# Patient Record
Sex: Female | Born: 1975
Health system: Southern US, Community
[De-identification: ages and names within clinical notes are randomized; demographics above are authoritative.]

## PROBLEM LIST (undated history)

## (undated) DIAGNOSIS — F3181 Bipolar II disorder: Secondary | ICD-10-CM

## (undated) HISTORY — DX: Bipolar II disorder: F31.81

---

## 2014-09-07 ENCOUNTER — Other Ambulatory Visit (HOSPITAL_COMMUNITY)
Admission: RE | Admit: 2014-09-07 | Discharge: 2014-09-07 | Disposition: A | Discharge status: Home / Self Care | Payer: BLUE CROSS/BLUE SHIELD | Source: Ambulatory Visit | Attending: Obstetrics & Gynecology | Admitting: Obstetrics & Gynecology

## 2014-09-07 DIAGNOSIS — Z113 Encounter for screening for infections with a predominantly sexual mode of transmission: Secondary | ICD-10-CM | POA: Diagnosis present

## 2014-09-07 DIAGNOSIS — Z1151 Encounter for screening for human papillomavirus (HPV): Secondary | ICD-10-CM | POA: Insufficient documentation

## 2014-09-07 DIAGNOSIS — Z01411 Encounter for gynecological examination (general) (routine) with abnormal findings: Secondary | ICD-10-CM | POA: Diagnosis present

## 2016-10-22 MED FILL — ZALEPLON 10 MG CAPSULE: 10 | 30 days supply | Qty: 30 | Fill #0

## 2016-10-22 MED FILL — lamoTRIgine 100 MG TABS: 100 | 30 days supply | Qty: 30 | Fill #0

## 2016-10-22 MED FILL — DIVALPROEX SOD ER 500 MG TA: 500 | 30 days supply | Qty: 60 | Fill #0

## 2016-11-19 MED FILL — lamoTRIgine 100 MG TABS: 100 | 30 days supply | Qty: 30 | Fill #1

## 2016-11-19 MED FILL — DIVALPROEX SOD ER 500 MG TA: 500 | 30 days supply | Qty: 60 | Fill #1

## 2016-12-19 DIAGNOSIS — F3181 Bipolar II disorder: Secondary | ICD-10-CM | POA: Diagnosis not present

## 2016-12-24 MED FILL — lamoTRIgine 100 MG TABS: 100 | 90 days supply | Qty: 90 | Fill #0

## 2016-12-24 MED FILL — DIVALPROEX SOD ER 500 MG TA: 500 | 90 days supply | Qty: 180 | Fill #0

## 2017-01-09 DIAGNOSIS — Z872 Personal history of diseases of the skin and subcutaneous tissue: Secondary | ICD-10-CM | POA: Diagnosis not present

## 2017-01-09 DIAGNOSIS — L821 Other seborrheic keratosis: Secondary | ICD-10-CM | POA: Diagnosis not present

## 2017-01-09 DIAGNOSIS — L814 Other melanin hyperpigmentation: Secondary | ICD-10-CM | POA: Diagnosis not present

## 2017-01-09 DIAGNOSIS — D225 Melanocytic nevi of trunk: Secondary | ICD-10-CM | POA: Diagnosis not present

## 2017-01-23 DIAGNOSIS — R946 Abnormal results of thyroid function studies: Secondary | ICD-10-CM | POA: Diagnosis not present

## 2017-01-23 DIAGNOSIS — R630 Anorexia: Secondary | ICD-10-CM | POA: Diagnosis not present

## 2017-01-25 DIAGNOSIS — F3181 Bipolar II disorder: Secondary | ICD-10-CM | POA: Diagnosis not present

## 2017-01-28 MED FILL — busPIRone HCL 15 MG TABS: 15 | 30 days supply | Qty: 60 | Fill #0

## 2017-03-27 MED FILL — lamoTRIgine 100 MG TABS: 100 | 90 days supply | Qty: 90 | Fill #1

## 2017-03-27 MED FILL — DIVALPROEX SOD ER 500 MG TA: 500 | 90 days supply | Qty: 180 | Fill #1

## 2017-04-12 DIAGNOSIS — R7989 Other specified abnormal findings of blood chemistry: Secondary | ICD-10-CM | POA: Diagnosis not present

## 2017-04-12 DIAGNOSIS — R630 Anorexia: Secondary | ICD-10-CM | POA: Diagnosis not present

## 2017-04-19 DIAGNOSIS — Z202 Contact with and (suspected) exposure to infections with a predominantly sexual mode of transmission: Secondary | ICD-10-CM | POA: Diagnosis not present

## 2017-04-29 DIAGNOSIS — F3181 Bipolar II disorder: Secondary | ICD-10-CM | POA: Diagnosis not present

## 2017-06-21 MED FILL — DIVALPROEX SOD ER 500 MG TA: 500 | 30 days supply | Qty: 60 | Fill #0

## 2017-06-21 MED FILL — lamoTRIgine 100 MG TABS: 100 | 30 days supply | Qty: 30 | Fill #0

## 2017-06-24 MED FILL — ZALEPLON 10 MG CAPSULE: 10 | 30 days supply | Qty: 30 | Fill #0

## 2017-07-17 MED FILL — DIVALPROEX SOD ER 500 MG TA: 500 | 30 days supply | Qty: 60 | Fill #0

## 2017-07-17 MED FILL — lamoTRIgine 100 MG TABS: 100 | 30 days supply | Qty: 30 | Fill #0

## 2017-08-23 MED FILL — DIVALPROEX SOD ER 500 MG TA: 500 | 30 days supply | Qty: 60 | Fill #0

## 2017-08-23 MED FILL — lamoTRIgine 100 MG TABS: 100 | 30 days supply | Qty: 30 | Fill #0

## 2017-08-23 MED FILL — ZALEPLON 10 MG CAPSULE: 10 | 30 days supply | Qty: 30 | Fill #0

## 2017-09-25 MED FILL — lamoTRIgine 100 MG TABS: 100 | 30 days supply | Qty: 30 | Fill #0

## 2017-09-25 MED FILL — DIVALPROEX SOD ER 500 MG TA: 500 | 30 days supply | Qty: 60 | Fill #0

## 2017-09-30 DIAGNOSIS — F3181 Bipolar II disorder: Secondary | ICD-10-CM | POA: Diagnosis not present

## 2017-10-18 MED FILL — ALPRAZolam 0.25 MG TABS: 0.25 | 30 days supply | Qty: 30 | Fill #0

## 2017-10-28 MED FILL — lamoTRIgine 100 MG TABS: 100 | 90 days supply | Qty: 90 | Fill #0

## 2017-10-28 MED FILL — ZALEPLON 10 MG CAPSULE: 10 | 30 days supply | Qty: 30 | Fill #0

## 2017-10-28 MED FILL — DIVALPROEX SOD ER 500 MG TA: 500 | 90 days supply | Qty: 180 | Fill #0

## 2017-11-19 ENCOUNTER — Other Ambulatory Visit: Payer: Self-pay | Admitting: Nurse Practitioner

## 2017-11-19 ENCOUNTER — Other Ambulatory Visit (HOSPITAL_COMMUNITY)
Admission: RE | Admit: 2017-11-19 | Discharge: 2017-11-19 | Disposition: A | Discharge status: Home / Self Care | Payer: 59 | Source: Ambulatory Visit | Attending: Nurse Practitioner | Admitting: Nurse Practitioner

## 2017-11-19 DIAGNOSIS — N644 Mastodynia: Secondary | ICD-10-CM | POA: Diagnosis not present

## 2017-11-19 DIAGNOSIS — Z01419 Encounter for gynecological examination (general) (routine) without abnormal findings: Secondary | ICD-10-CM | POA: Diagnosis not present

## 2017-11-19 DIAGNOSIS — Z113 Encounter for screening for infections with a predominantly sexual mode of transmission: Secondary | ICD-10-CM | POA: Diagnosis not present

## 2017-11-22 LAB — CYTOLOGY - PAP
CHLAMYDIA, DNA PROBE: NEGATIVE
DIAGNOSIS: NEGATIVE
HPV: NOT DETECTED
Neisseria Gonorrhea: NEGATIVE

## 2017-12-27 MED FILL — ZALEPLON 10 MG CAPSULE: 10 | 30 days supply | Qty: 30 | Fill #1

## 2018-01-13 ENCOUNTER — Ambulatory Visit: Payer: Self-pay | Admitting: Nurse Practitioner

## 2018-01-13 VITALS — BP 108/72 | HR 67 | Temp 98.5°F | Resp 20 | Ht 68.0 in | Wt 140.2 lb

## 2018-01-13 DIAGNOSIS — Z0000 Encounter for general adult medical examination without abnormal findings: Secondary | ICD-10-CM

## 2018-01-13 NOTE — Progress Notes (Signed)
Subjective:  Cindy Woods is a 42 y.o. female who presents for basic physical exam.  The patient is presenting for a routine exam to keep her insurance premiums down as a requirement for [employer] health.  Patient denies any current health related concerns.  Patient does have a history of depression for which she takes Depakote and Lamictal.  Patient states her symptoms are well controlled at this time.  The patient denies any other past medical history such as heart disease, lung disease, liver disease, kidney disease, diabetes, hypertension, or seizures.  The patient's last menstrual period was Dec 23, 2017.  The patient denies any past surgical history.  The patient is not married, does not have children and lives alone.  The patient denies the use of alcohol, smoking, or recreational drugs.   Social History   Tobacco Use  . Smoking status: Not on file  Substance Use Topics  . Alcohol use: Not on file  . Drug use: Not on file    No Known Allergies  Current Outpatient Medications  Medication Sig Dispense Refill  . divalproex (DEPAKOTE) 500 MG DR tablet Take 1,000 mg by mouth 3 (three) times daily.    Marland Kitchen lamoTRIgine (LAMICTAL) 100 MG tablet Take 100 mg by mouth daily.     No current facility-administered medications for this visit.     Review of Systems  Constitutional: Negative.   HENT: Negative.   Eyes: Negative.   Respiratory: Negative.   Cardiovascular: Negative.   Gastrointestinal: Negative.   Genitourinary: Negative.   Musculoskeletal: Negative.   Skin: Negative.   Neurological: Negative.   Endo/Heme/Allergies: Negative.   Psychiatric/Behavioral: Negative.      Objective:  BP 108/72 (BP Location: Right Leg, Patient Position: Sitting, Cuff Size: Normal)   Pulse 67   Temp 98.5 F (36.9 C) (Oral)   Resp 20   Ht 5' 0.96" (1.548 m)   Wt 140 lb 3.2 oz (63.6 kg)   SpO2 97%   BMI 26.53 kg/m   General Appearance:  Alert, cooperative, no distress, appears stated age   Head:  Normocephalic, without obvious abnormality, atraumatic  Eyes:  PERRL, conjunctiva/corneas clear, EOM's intact, fundi benign, both eyes  Ears:  Normal TM's and external ear canals, both ears  Nose: Nares normal, septum midline,mucosa normal, no drainage or sinus tenderness  Throat: Lips, mucosa, and tongue normal; teeth and gums normal  Neck: Supple, symmetrical, trachea midline, no adenopathy;  thyroid: not enlarged, symmetric, no tenderness/mass/nodules; no carotid bruit or JVD  Back:   Symmetric, no curvature, ROM normal, no CVA tenderness  Lungs:   Clear to auscultation bilaterally, respirations unlabored  Breasts:  Deferred  Heart:  Regular rate and rhythm, S1 and S2 normal, no murmur, rub, or gallop  Abdomen:   Soft, non-tender, bowel sounds active all four quadrants,  no masses, no organomegaly  Pelvic: Deferred  Extremities: Extremities normal, atraumatic, no cyanosis or edema  Pulses: 2+ and symmetric  Skin: Skin color, texture, turgor normal, no rashes or lesions  Lymph nodes: Cervical, supraclavicular, and axillary nodes normal  Neurologic: Normal     Assessment:  basic physical exam    Plan:  Patient education provided.  She is instructed to follow-up with primary care physician for lab work, or additional screening and follow-up.  The patient was given patient education for health maintenance and health prevention for her age group.  The patient will continue her regular routine for diet and exercise.  The patient verbalizes no understanding and has no  questions at time of discharge. Patient will follow up with PCP.

## 2018-01-13 NOTE — Patient Instructions (Signed)
Health Maintenance, Female Adopting a healthy lifestyle and getting preventive care can go a long way to promote health and wellness. Talk with your health care provider about what schedule of regular examinations is right for you. This is a good chance for you to check in with your provider about disease prevention and staying healthy. In between checkups, there are plenty of things you can do on your own. Experts have done a lot of research about which lifestyle changes and preventive measures are most likely to keep you healthy. Ask your health care provider for more information. Weight and diet Eat a healthy diet  Be sure to include plenty of vegetables, fruits, low-fat dairy products, and lean protein.  Do not eat a lot of foods high in solid fats, added sugars, or salt.  Get regular exercise. This is one of the most important things you can do for your health. ? Most adults should exercise for at least 150 minutes each week. The exercise should increase your heart rate and make you sweat (moderate-intensity exercise). ? Most adults should also do strengthening exercises at least twice a week. This is in addition to the moderate-intensity exercise.  Maintain a healthy weight  Body mass index (BMI) is a measurement that can be used to identify possible weight problems. It estimates body fat based on height and weight. Your health care provider can help determine your BMI and help you achieve or maintain a healthy weight.  For females 20 years of age and older: ? A BMI below 18.5 is considered underweight. ? A BMI of 18.5 to 24.9 is normal. ? A BMI of 25 to 29.9 is considered overweight. ? A BMI of 30 and above is considered obese.  Watch levels of cholesterol and blood lipids  You should start having your blood tested for lipids and cholesterol at 40 years of age, then have this test every 5 years.  You may need to have your cholesterol levels checked more often if: ? Your lipid or  cholesterol levels are high. ? You are older than 40 years of age. ? You are at high risk for heart disease.  Cancer screening Lung Cancer  Lung cancer screening is recommended for adults 55-80 years old who are at high risk for lung cancer because of a history of smoking.  A yearly low-dose CT scan of the lungs is recommended for people who: ? Currently smoke. ? Have quit within the past 15 years. ? Have at least a 30-pack-year history of smoking. A pack year is smoking an average of one pack of cigarettes a day for 1 year.  Yearly screening should continue until it has been 15 years since you quit.  Yearly screening should stop if you develop a health problem that would prevent you from having lung cancer treatment.  Breast Cancer  Practice breast self-awareness. This means understanding how your breasts normally appear and feel.  It also means doing regular breast self-exams. Let your health care provider know about any changes, no matter how small.  If you are in your 20s or 30s, you should have a clinical breast exam (CBE) by a health care provider every 1-3 years as part of a regular health exam.  If you are 40 or older, have a CBE every year. Also consider having a breast X-ray (mammogram) every year.  If you have a family history of breast cancer, talk to your health care provider about genetic screening.  If you are at high risk   for breast cancer, talk to your health care provider about having an MRI and a mammogram every year.  Breast cancer gene (BRCA) assessment is recommended for women who have family members with BRCA-related cancers. BRCA-related cancers include: ? Breast. ? Ovarian. ? Tubal. ? Peritoneal cancers.  Results of the assessment will determine the need for genetic counseling and BRCA1 and BRCA2 testing.  Cervical Cancer Your health care provider may recommend that you be screened regularly for cancer of the pelvic organs (ovaries, uterus, and  vagina). This screening involves a pelvic examination, including checking for microscopic changes to the surface of your cervix (Pap test). You may be encouraged to have this screening done every 3 years, beginning at age 22.  For women ages 56-65, health care providers may recommend pelvic exams and Pap testing every 3 years, or they may recommend the Pap and pelvic exam, combined with testing for human papilloma virus (HPV), every 5 years. Some types of HPV increase your risk of cervical cancer. Testing for HPV may also be done on women of any age with unclear Pap test results.  Other health care providers may not recommend any screening for nonpregnant women who are considered low risk for pelvic cancer and who do not have symptoms. Ask your health care provider if a screening pelvic exam is right for you.  If you have had past treatment for cervical cancer or a condition that could lead to cancer, you need Pap tests and screening for cancer for at least 20 years after your treatment. If Pap tests have been discontinued, your risk factors (such as having a new sexual partner) need to be reassessed to determine if screening should resume. Some women have medical problems that increase the chance of getting cervical cancer. In these cases, your health care provider may recommend more frequent screening and Pap tests.  Colorectal Cancer  This type of cancer can be detected and often prevented.  Routine colorectal cancer screening usually begins at 40 years of age and continues through 40 years of age.  Your health care provider may recommend screening at an earlier age if you have risk factors for colon cancer.  Your health care provider may also recommend using home test kits to check for hidden blood in the stool.  A small camera at the end of a tube can be used to examine your colon directly (sigmoidoscopy or colonoscopy). This is done to check for the earliest forms of colorectal  cancer.  Routine screening usually begins at age 33.  Direct examination of the colon should be repeated every 5-10 years through 40 years of age. However, you may need to be screened more often if early forms of precancerous polyps or small growths are found.  Skin Cancer  Check your skin from head to toe regularly.  Tell your health care provider about any new moles or changes in moles, especially if there is a change in a mole's shape or color.  Also tell your health care provider if you have a mole that is larger than the size of a pencil eraser.  Always use sunscreen. Apply sunscreen liberally and repeatedly throughout the day.  Protect yourself by wearing long sleeves, pants, a wide-brimmed hat, and sunglasses whenever you are outside.  Heart disease, diabetes, and high blood pressure  High blood pressure causes heart disease and increases the risk of stroke. High blood pressure is more likely to develop in: ? People who have blood pressure in the high end of  the normal range (130-139/85-89 mm Hg). ? People who are overweight or obese. ? People who are African American.  If you are 21-29 years of age, have your blood pressure checked every 3-5 years. If you are 3 years of age or older, have your blood pressure checked every year. You should have your blood pressure measured twice-once when you are at a hospital or clinic, and once when you are not at a hospital or clinic. Record the average of the two measurements. To check your blood pressure when you are not at a hospital or clinic, you can use: ? An automated blood pressure machine at a pharmacy. ? A home blood pressure monitor.  If you are between 17 years and 37 years old, ask your health care provider if you should take aspirin to prevent strokes.  Have regular diabetes screenings. This involves taking a blood sample to check your fasting blood sugar level. ? If you are at a normal weight and have a low risk for diabetes,  have this test once every three years after 40 years of age. ? If you are overweight and have a high risk for diabetes, consider being tested at a younger age or more often. Preventing infection Hepatitis B  If you have a higher risk for hepatitis B, you should be screened for this virus. You are considered at high risk for hepatitis B if: ? You were born in a country where hepatitis B is common. Ask your health care provider which countries are considered high risk. ? Your parents were born in a high-risk country, and you have not been immunized against hepatitis B (hepatitis B vaccine). ? You have HIV or AIDS. ? You use needles to inject street drugs. ? You live with someone who has hepatitis B. ? You have had sex with someone who has hepatitis B. ? You get hemodialysis treatment. ? You take certain medicines for conditions, including cancer, organ transplantation, and autoimmune conditions.  Hepatitis C  Blood testing is recommended for: ? Everyone born from [birth year] through 1963. ? Anyone with known risk factors for hepatitis C.  Sexually transmitted infections (STIs)  You should be screened for sexually transmitted infections (STIs) including gonorrhea and chlamydia if: ? You are sexually active and are younger than 40 years of age. ? You are older than 40 years of age and your health care provider tells you that you are at risk for this type of infection. ? Your sexual activity has changed since you were last screened and you are at an increased risk for chlamydia or gonorrhea. Ask your health care provider if you are at risk.  If you do not have HIV, but are at risk, it may be recommended that you take a prescription medicine daily to prevent HIV infection. This is called pre-exposure prophylaxis (PrEP). You are considered at risk if: ? You are sexually active and do not regularly use condoms or know the HIV status of your partner(s). ? You take drugs by injection. ? You are  sexually active with a partner who has HIV.  Talk with your health care provider about whether you are at high risk of being infected with HIV. If you choose to begin PrEP, you should first be tested for HIV. You should then be tested every 3 months for as long as you are taking PrEP. Pregnancy  If you are premenopausal and you may become pregnant, ask your health care provider about preconception counseling.  If you may become  pregnant, take 400 to 800 micrograms (mcg) of folic acid every day.  If you want to prevent pregnancy, talk to your health care provider about birth control (contraception). Osteoporosis and menopause  Osteoporosis is a disease in which the bones lose minerals and strength with aging. This can result in serious bone fractures. Your risk for osteoporosis can be identified using a bone density scan.  If you are 65 years of age or older, or if you are at risk for osteoporosis and fractures, ask your health care provider if you should be screened.  Ask your health care provider whether you should take a calcium or vitamin D supplement to lower your risk for osteoporosis.  Menopause may have certain physical symptoms and risks.  Hormone replacement therapy may reduce some of these symptoms and risks. Talk to your health care provider about whether hormone replacement therapy is right for you. Follow these instructions at home:  Schedule regular health, dental, and eye exams.  Stay current with your immunizations.  Do not use any tobacco products including cigarettes, chewing tobacco, or electronic cigarettes.  If you are pregnant, do not drink alcohol.  If you are breastfeeding, limit how much and how often you drink alcohol.  Limit alcohol intake to no more than 1 drink per day for nonpregnant women. One drink equals 12 ounces of beer, 5 ounces of wine, or 1 ounces of hard liquor.  Do not use street drugs.  Do not share needles.  Ask your health care  provider for help if you need support or information about quitting drugs.  Tell your health care provider if you often feel depressed.  Tell your health care provider if you have ever been abused or do not feel safe at home. This information is not intended to replace advice given to you by your health care provider. Make sure you discuss any questions you have with your health care provider. Document Released: 02/05/2011 Document Revised: 12/29/2015 Document Reviewed: 04/26/2015 Elsevier Interactive Patient Education  2018 [employer]  Preventive Care 18-39 Years, Female Preventive care refers to lifestyle choices and visits with your health care provider that can promote health and wellness. What does preventive care include?  A yearly physical exam. This is also called an annual well check.  Dental exams once or twice a year.  Routine eye exams. Ask your health care provider how often you should have your eyes checked.  Personal lifestyle choices, including: ? Daily care of your teeth and gums. ? Regular physical activity. ? Eating a healthy diet. ? Avoiding tobacco and drug use. ? Limiting alcohol use. ? Practicing safe sex. ? Taking vitamin and mineral supplements as recommended by your health care provider. What happens during an annual well check? The services and screenings done by your health care provider during your annual well check will depend on your age, overall health, lifestyle risk factors, and family history of disease. Counseling Your health care provider may ask you questions about your:  Alcohol use.  Tobacco use.  Drug use.  Emotional well-being.  Home and relationship well-being.  Sexual activity.  Eating habits.  Work and work environment.  Method of birth control.  Menstrual cycle.  Pregnancy history.  Screening You may have the following tests or measurements:  Height, weight, and BMI.  Diabetes screening. This is done by  checking your blood sugar (glucose) after you have not eaten for a while (fasting).  Blood pressure.  Lipid and cholesterol levels. These may be checked   every 5 years starting at age 76.  Skin check.  Hepatitis C blood test.  Hepatitis B blood test.  Sexually transmitted disease (STD) testing.  BRCA-related cancer screening. This may be done if you have a family history of breast, ovarian, tubal, or peritoneal cancers.  Pelvic exam and Pap test. This may be done every 3 years starting at age 64. Starting at age 64, this may be done every 5 years if you have a Pap test in combination with an HPV test.  Discuss your test results, treatment options, and if necessary, the need for more tests with your health care provider. Vaccines Your health care provider may recommend certain vaccines, such as:  Influenza vaccine. This is recommended every year.  Tetanus, diphtheria, and acellular pertussis (Tdap, Td) vaccine. You may need a Td booster every 10 years.  Varicella vaccine. You may need this if you have not been vaccinated.  HPV vaccine. If you are 48 or younger, you may need three doses over 6 months.  Measles, mumps, and rubella (MMR) vaccine. You may need at least one dose of MMR. You may also need a second dose.  Pneumococcal 13-valent conjugate (PCV13) vaccine. You may need this if you have certain conditions and were not previously vaccinated.  Pneumococcal polysaccharide (PPSV23) vaccine. You may need one or two doses if you smoke cigarettes or if you have certain conditions.  Meningococcal vaccine. One dose is recommended if you are age 16-21 years and a first-year college student living in a residence hall, or if you have one of several medical conditions. You may also need additional booster doses.  Hepatitis A vaccine. You may need this if you have certain conditions or if you travel or work in places where you may be exposed to hepatitis A.  Hepatitis B vaccine. You  may need this if you have certain conditions or if you travel or work in places where you may be exposed to hepatitis B.  Haemophilus influenzae type b (Hib) vaccine. You may need this if you have certain risk factors.  Talk to your health care provider about which screenings and vaccines you need and how often you need them. This information is not intended to replace advice given to you by your health care provider. Make sure you discuss any questions you have with your health care provider. Document Released: 09/18/2001 Document Revised: 04/11/2016 Document Reviewed: 05/24/2015 Elsevier Interactive Patient Education  [employer].

## 2018-01-20 DIAGNOSIS — F3181 Bipolar II disorder: Secondary | ICD-10-CM | POA: Diagnosis not present

## 2018-01-27 MED FILL — lamoTRIgine 100 MG TABS: 100 | 90 days supply | Qty: 90 | Fill #0

## 2018-01-27 MED FILL — DIVALPROEX SOD ER 500 MG TA: 500 | 90 days supply | Qty: 180 | Fill #0

## 2018-01-27 MED FILL — ZALEPLON 10 MG CAPSULE: 10 | 30 days supply | Qty: 30 | Fill #0

## 2018-02-27 DIAGNOSIS — F3181 Bipolar II disorder: Secondary | ICD-10-CM | POA: Diagnosis not present

## 2018-03-18 MED FILL — ZALEPLON 10 MG CAPSULE: 10 | 30 days supply | Qty: 30 | Fill #1

## 2018-03-18 MED FILL — TEMAZEPAM 7.5 MG CAPS: 7.5 | 8 days supply | Qty: 16 | Fill #0

## 2018-04-24 MED FILL — DIVALPROEX SOD ER 500 MG TA: 500 | 90 days supply | Qty: 180 | Fill #1

## 2018-04-24 MED FILL — lamoTRIgine 100 MG TABS: 100 | 90 days supply | Qty: 90 | Fill #1

## 2018-04-24 MED FILL — ZALEPLON 10 MG CAPSULE: 10 | 30 days supply | Qty: 30 | Fill #2

## 2018-04-25 MED FILL — ALPRAZolam 0.25 MG TABS: 0.25 | 30 days supply | Qty: 30 | Fill #0

## 2018-05-28 MED FILL — ZALEPLON 10 MG CAPSULE: 10 | 30 days supply | Qty: 30 | Fill #3

## 2018-06-15 DIAGNOSIS — F3181 Bipolar II disorder: Secondary | ICD-10-CM

## 2018-06-15 DIAGNOSIS — F419 Anxiety disorder, unspecified: Secondary | ICD-10-CM | POA: Insufficient documentation

## 2018-06-15 DIAGNOSIS — F458 Other somatoform disorders: Secondary | ICD-10-CM

## 2018-06-15 HISTORY — DX: Bipolar II disorder: F31.81

## 2018-07-01 ENCOUNTER — Telehealth: Payer: Self-pay | Admitting: Psychiatry

## 2018-07-01 NOTE — Telephone Encounter (Signed)
Called pharmacy to let them know it was ok to change sonata from 10mg  to 5mg .

## 2018-07-01 NOTE — Telephone Encounter (Signed)
[facility] called to say they cannot get the Sonata 10 mg.  Is it ok to fill with 5 mg and prescribe two?

## 2018-07-02 ENCOUNTER — Ambulatory Visit: Payer: Self-pay | Admitting: Psychiatry

## 2018-07-07 ENCOUNTER — Ambulatory Visit (INDEPENDENT_AMBULATORY_CARE_PROVIDER_SITE_OTHER): Payer: 59 | Admitting: Psychiatry

## 2018-07-07 ENCOUNTER — Encounter: Payer: Self-pay | Admitting: Psychiatry

## 2018-07-07 VITALS — BP 95/64 | HR 81

## 2018-07-07 DIAGNOSIS — R5382 Chronic fatigue, unspecified: Secondary | ICD-10-CM | POA: Diagnosis not present

## 2018-07-07 DIAGNOSIS — Z79899 Other long term (current) drug therapy: Secondary | ICD-10-CM

## 2018-07-07 DIAGNOSIS — F39 Unspecified mood [affective] disorder: Secondary | ICD-10-CM | POA: Diagnosis not present

## 2018-07-07 DIAGNOSIS — F5101 Primary insomnia: Secondary | ICD-10-CM

## 2018-07-07 MED ORDER — ALPRAZOLAM 0.25 MG PO TABS: 0.2500 mg | ORAL_TABLET | Freq: Every day | ORAL | 0 refills | Status: DC

## 2018-07-07 MED ORDER — LAMOTRIGINE 100 MG PO TABS: 100.0000 mg | ORAL_TABLET | Freq: Every day | ORAL | 1 refills | Status: DC

## 2018-07-07 MED ORDER — DIVALPROEX SODIUM ER 500 MG PO TB24: 1000.0000 mg | ORAL_TABLET | Freq: Every day | ORAL | 1 refills | Status: DC

## 2018-07-07 MED ORDER — VORTIOXETINE HBR 5 MG PO TABS: 5.0000 mg | ORAL_TABLET | Freq: Every day | ORAL | Status: AC

## 2018-07-07 MED FILL — ALPRAZolam 0.25 MG TABS: 0.25 | 30 days supply | Qty: 30 | Fill #0

## 2018-07-07 NOTE — Progress Notes (Signed)
Cindy Woods [id number] [birth year] 42 y.o.  Subjective:   [name] is a 42 y.o. (DOB [birth year]) female.  Chief Complaint:  Chief Complaint  Patient presents with  . Insomnia  . Fatigue  . Follow-up    h/o Mood s/s and Anxiety    HPI Cindy Woods presents to the office today for follow-up of anxiety, insomnia, and mood s/s. Reports that she has tried taking medications at different times and has missed doses at times. Reports that sadness is "not anymore than usual." Reports some irritability at baseline.   She reports that she feels persistently tired. Reports that her motivation is "terrible. All I want to do is watching TV."  "I never get enough sleep." She reports that she takes Sonata or Xanax to help with sleep and that [employer] is no longer as effective. She reports difficulty falling asleep. Denies difficulty with middle of the night or early morning awakening. Estimates sleeping about 7 hours a night and feels that her body needs 9.5 hours of sleep in order to feel rested. Appetite has been ok. Reports concentration is "not that good." She reports that focusing at work requires effort.   Reports that coffee will boost her mood and energy for a brief period of time. Reports that she took Trazodone in the past and wonders if she should re-start this.   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 Reports that she is not used to working 5 days a week.   Reports that she has not had labs drawn yet.   Review of Systems:  Review of Systems  Constitutional: Positive for fatigue.  Gastrointestinal: Negative.   Musculoskeletal: Negative for gait problem.  Neurological: Negative for tremors and headaches.  Psychiatric/Behavioral:       Please refer to HPI    Medications: I have reviewed the patient's current medications.  Current Outpatient Medications  Medication Sig Dispense Refill  . ALPRAZolam (XANAX) 0.25 MG tablet Take 1  tablet (0.25 mg total) by mouth daily. 1/2 to 1 tab 30 tablet 0  . lamoTRIgine (LAMICTAL) 100 MG tablet Take 1 tablet (100 mg total) by mouth daily. 90 tablet 1  . MELATONIN PO Take by mouth.    . divalproex (DEPAKOTE ER) 500 MG 24 hr tablet Take 2 tablets (1,000 mg total) by mouth daily. 180 tablet 1  . vortioxetine HBr (TRINTELLIX) 5 MG TABS tablet Take 1 tablet (5 mg total) by mouth daily. Samples provided 30 tablet   . zaleplon (SONATA) 10 MG capsule Take 1 capsule (10 mg total) by mouth at bedtime as needed for sleep (1-2 tabs). 60 capsule 0   No current facility-administered medications for this visit.     Medication Side Effects: None  Allergies: No Known Allergies  History reviewed. No pertinent past medical history.  Family History  Problem Relation Age of Onset  . Depression Sister   . Anxiety disorder Brother     Social History   Socioeconomic History  . Marital status: Unknown    Spouse name: Not on file  . Number of children: Not on file  . Years of education: Not on file  . Highest education level: Not on file  Occupational History  . Not on file  Social Needs  . Financial resource strain: Not on file  . Food insecurity:    Worry: Not on file    Inability: Not on file  . Transportation needs:  Medical: Not on file    Non-medical: Not on file  Tobacco Use  . Smoking status: Never Smoker  . Smokeless tobacco: Never Used  Substance and Sexual Activity  . Alcohol use: Yes    Comment: Rare- "once a month"  . Drug use: Not on file  . Sexual activity: Not on file  Lifestyle  . Physical activity:    Days per week: Not on file    Minutes per session: Not on file  . Stress: Not on file  Relationships  . Social connections:    Talks on phone: Not on file    Gets together: Not on file    Attends religious service: Not on file    Active member of club or organization: Not on file    Attends meetings of clubs or organizations: Not on file    Relationship  status: Not on file  . Intimate partner violence:    Fear of current or ex partner: Not on file    Emotionally abused: Not on file    Physically abused: Not on file    Forced sexual activity: Not on file  Other Topics Concern  . Not on file  Social History Narrative  . Not on file    Past Medical History, Surgical history, Social history, and Family history were reviewed and updated as appropriate.   Please see review of systems for further details on the patient's review from today.   Objective:   Physical Exam:  BP 95/64   Pulse 81   Physical Exam  Constitutional: She is oriented to person, place, and time. She appears well-developed. No distress.  Musculoskeletal: She exhibits no deformity.  Neurological: She is alert and oriented to person, place, and time. Coordination normal.  Psychiatric: Her speech is normal. Judgment and thought content normal. Her mood appears anxious. Her affect is not blunt, not labile and not inappropriate. Cognition and memory are normal. She exhibits a depressed mood. She expresses no homicidal and no suicidal ideation. She expresses no suicidal plans and no homicidal plans.  Mood presents as irritable Presents as tense and guarded.  Insight intact. No auditory or visual hallucinations. No delusions.     Lab Review:  No results found for: NA, K, CL, CO2, GLUCOSE, BUN, CREATININE, CALCIUM, PROT, ALBUMIN, AST, ALT, ALKPHOS, BILITOT, GFRNONAA, GFRAA  No results found for: WBC, RBC, HGB, HCT, PLT, MCV, MCH, MCHC, RDW, LYMPHSABS, MONOABS, EOSABS, BASOSABS  No results found for: POCLITH, LITHIUM   No results found for: PHENYTOIN, PHENOBARB, VALPROATE, CBMZ   .res Assessment: Plan:   Patient seen for 30 minutes and greater than 50% of visit spent counseling patient regarding lab work and recommended obtaining lab work to rule out potential adverse effects as well as possible medical causes for persistent fatigue.  Discussed possible treatment  options to include Trintellix and potential benefits, risks, and side effects.  Instructed patient to take Trintellix with a meal to minimize risk of GI side effects.  Discussed starting Trintellix at low-dose of 5 mg daily for depression and continuing for 1 month considering her history of medication sensitivity and adverse reactions to multiple medications in the past.  Will continue alprazolam for insomnia and Depakote and Lamictal for mood stabilization.  Patient inquired about starting therapy with a new therapist and was provided with referral information.  Patient to follow-up with this provider in 4 weeks or sooner if clinically indicated. High risk medication use - Plan: Valproic acid level, Lamotrigine level, CBC with  Differential/Platelet, TSH, Comprehensive metabolic panel, Vitamin D 1,25 dihydroxy  Chronic fatigue - Plan: Valproic acid level, Lamotrigine level, CBC with Differential/Platelet, TSH, Comprehensive metabolic panel, Vitamin D 1,25 dihydroxy  Episodic mood disorder (HCC) - Plan: divalproex (DEPAKOTE ER) 500 MG 24 hr tablet, lamoTRIgine (LAMICTAL) 100 MG tablet, vortioxetine HBr (TRINTELLIX) 5 MG TABS tablet  Primary insomnia - Plan: ALPRAZolam (XANAX) 0.25 MG tablet  Please see After Visit Summary for patient specific instructions.  No future appointments.  Orders Placed This Encounter  Procedures  . Valproic acid level  . Lamotrigine level  . CBC with Differential/Platelet  . TSH  . Comprehensive metabolic panel  . Vitamin D 1,25 dihydroxy      -------------------------------

## 2018-07-08 ENCOUNTER — Other Ambulatory Visit: Payer: Self-pay

## 2018-07-08 MED ORDER — ZALEPLON 10 MG PO CAPS: 10.0000 mg | ORAL_CAPSULE | Freq: Every evening | ORAL | 0 refills | Status: DC | PRN

## 2018-07-08 MED FILL — ZALEPLON 10 MG CAPSULE: 10 | 30 days supply | Qty: 30 | Fill #4

## 2018-07-16 DIAGNOSIS — Z79899 Other long term (current) drug therapy: Secondary | ICD-10-CM | POA: Diagnosis not present

## 2018-07-16 DIAGNOSIS — R5382 Chronic fatigue, unspecified: Secondary | ICD-10-CM | POA: Diagnosis not present

## 2018-07-21 LAB — CBC WITH DIFFERENTIAL/PLATELET
Basophils Absolute: 0 10*3/uL (ref 0.0–0.2)
Basos: 1 %
EOS (ABSOLUTE): 0.2 10*3/uL (ref 0.0–0.4)
EOS: 3 %
HEMATOCRIT: 38.8 % (ref 34.0–46.6)
Hemoglobin: 13.1 g/dL (ref 11.1–15.9)
Immature Grans (Abs): 0 10*3/uL (ref 0.0–0.1)
Immature Granulocytes: 0 %
LYMPHS ABS: 2 10*3/uL (ref 0.7–3.1)
Lymphs: 35 %
MCH: 32 pg (ref 26.6–33.0)
MCHC: 33.8 g/dL (ref 31.5–35.7)
MCV: 95 fL (ref 79–97)
MONOS ABS: 0.4 10*3/uL (ref 0.1–0.9)
Monocytes: 7 %
Neutrophils Absolute: 3.1 10*3/uL (ref 1.4–7.0)
Neutrophils: 54 %
Platelets: 186 10*3/uL (ref 150–450)
RBC: 4.1 x10E6/uL (ref 3.77–5.28)
RDW: 11.8 % — AB (ref 12.3–15.4)
WBC: 5.7 10*3/uL (ref 3.4–10.8)

## 2018-07-21 LAB — VITAMIN D 1,25 DIHYDROXY
Vitamin D 1, 25 (OH)2 Total: 26 pg/mL
Vitamin D3 1, 25 (OH)2: 26 pg/mL

## 2018-07-21 LAB — COMPREHENSIVE METABOLIC PANEL
ALT: 9 IU/L (ref 0–32)
AST: 11 IU/L (ref 0–40)
Albumin/Globulin Ratio: 2.4 — ABNORMAL HIGH (ref 1.2–2.2)
Albumin: 4.6 g/dL (ref 3.5–5.5)
Alkaline Phosphatase: 37 IU/L — ABNORMAL LOW (ref 39–117)
BILIRUBIN TOTAL: 0.3 mg/dL (ref 0.0–1.2)
BUN/Creatinine Ratio: 17 (ref 9–23)
BUN: 15 mg/dL (ref 6–24)
CO2: 24 mmol/L (ref 20–29)
Calcium: 9.1 mg/dL (ref 8.7–10.2)
Chloride: 102 mmol/L (ref 96–106)
Creatinine, Ser: 0.88 mg/dL (ref 0.57–1.00)
GFR calc Af Amer: 95 mL/min/{1.73_m2} (ref >59)
GFR calc non Af Amer: 82 mL/min/{1.73_m2} (ref >59)
Globulin, Total: 1.9 g/dL (ref 1.5–4.5)
Glucose: 82 mg/dL (ref 65–99)
Potassium: 4.2 mmol/L (ref 3.5–5.2)
Sodium: 140 mmol/L (ref 134–144)
Total Protein: 6.5 g/dL (ref 6.0–8.5)

## 2018-07-21 LAB — TSH: TSH: 3.1 u[IU]/mL (ref 0.450–4.500)

## 2018-07-21 LAB — VALPROIC ACID LEVEL: Valproic Acid Lvl: 77 ug/mL (ref 50–100)

## 2018-07-21 LAB — LAMOTRIGINE LEVEL: Lamotrigine Lvl: 5.1 ug/mL (ref 2.0–20.0)

## 2018-07-23 NOTE — Progress Notes (Signed)
Pt notified of results

## 2018-07-24 MED FILL — DIVALPROEX SOD ER 500 MG TA: 500 | 90 days supply | Qty: 180 | Fill #0

## 2018-07-24 MED FILL — SUBVENITE 100 MG TABS: 100 | 90 days supply | Qty: 90 | Fill #0

## 2018-08-15 ENCOUNTER — Ambulatory Visit (INDEPENDENT_AMBULATORY_CARE_PROVIDER_SITE_OTHER): Payer: No Typology Code available for payment source | Admitting: Psychiatry

## 2018-08-15 ENCOUNTER — Encounter: Payer: Self-pay | Admitting: Psychiatry

## 2018-08-15 VITALS — BP 110/68 | HR 75

## 2018-08-15 DIAGNOSIS — F419 Anxiety disorder, unspecified: Secondary | ICD-10-CM | POA: Diagnosis not present

## 2018-08-15 DIAGNOSIS — F5101 Primary insomnia: Secondary | ICD-10-CM

## 2018-08-15 DIAGNOSIS — F39 Unspecified mood [affective] disorder: Secondary | ICD-10-CM | POA: Diagnosis not present

## 2018-08-15 MED ORDER — VORTIOXETINE HBR 5 MG PO TABS: ORAL_TABLET | ORAL | 0 refills | Status: DC

## 2018-08-15 MED ORDER — ZALEPLON 10 MG PO CAPS: 10.0000 mg | ORAL_CAPSULE | Freq: Every evening | ORAL | 2 refills | Status: DC | PRN

## 2018-08-15 NOTE — Progress Notes (Signed)
Cindy Woods [id number] 09-Sep-1975 43 y.o.  Subjective:   [name] is a 43 y.o. (DOB [birth year]) female.  Chief Complaint:  Chief Complaint  Patient presents with  . Anxiety  . Depression    HPI Cindy Woods presents to the office today for follow-up of depression and anxiety. She reports that she took Trintellix on a Sunday night and then felt more depressed, then "all that went away" and her mood was different and she felt more confident and outgoing- "a complete turn around, felt very stable." She reports that her anxiety was "gone" and she was relating to people more easily.  Denies manic s/s and reports that she continued to feel exhausted at the end of the day. Denies any impulsive or risky behaviors. She reports that this effect lasted about 2 weeks and then this response stopped. She reports that mood has not completely returned to baseline and is maybe improved compared to the past.   She reports that she has been getting recent HA's and thinks that it is stress related. Reports that she rarely gets HA's and now has anxiety that she may have a serious underlying condition. Reports trouble falling asleep since running out of Trintellix on Monday. Estimates sleeping 7 hours and needs 9 hours of sleep a night. She reports energy has been very low this week. 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 Appetite is ok and reports that she tends to eat out of boredom. She reports that she felt that concentration was "better" during 2 week period. Reports that her trainer commented that she has trouble focusing and is easily distracted. Reports that she has trouble completing a task if someone is talking to her. Denies SI.   Past Psychiatric  Medication Trials: Seroquel Viibryd Wellbutrin Klonopin Latuda Abilify Lithium Zoloft Effexor Celexa Paxil BuSpar Lexapro Nuvigil Sonata Xanax Rexulti-restlessness Ativan Hydroxyzine Trintellix Temazepam Lamictal Depakote Trazodone  Review of Systems:  Review of Systems  Gastrointestinal: Positive for constipation.  Musculoskeletal: Negative for gait problem.  Neurological: Positive for headaches. Negative for tremors.  Psychiatric/Behavioral:       Please refer to HPI    Medications: I have reviewed the patient's current medications.  Current Outpatient Medications  Medication Sig Dispense Refill  . divalproex (DEPAKOTE ER) 500 MG 24 hr tablet Take 2 tablets (1,000 mg total) by mouth daily. 180 tablet 1  . lamoTRIgine (LAMICTAL) 100 MG tablet Take 1 tablet (100 mg total) by mouth daily. 90 tablet 1  . MELATONIN PO Take by mouth.    . zaleplon (SONATA) 10 MG capsule Take 1 capsule (10 mg total) by mouth at bedtime as needed for up to 30 days for sleep (1-2 tabs). 60 capsule 2  . vortioxetine HBr (TRINTELLIX) 5 MG TABS tablet Take 5 mg po qd x 1 week, then increase to 10 mg po qd 30 tablet 0   No current facility-administered medications for this visit.     Medication Side Effects: None  Allergies: No Known Allergies  Past Medical History:  Diagnosis Date  . Bipolar II disorder ([place]) 06/15/2018    Family History  Problem Relation Age of Onset  . Depression Sister   . Anxiety disorder Brother     Social History  Socioeconomic History  . Marital status: Unknown    Spouse name: Not on file  . Number of children: Not on file  . Years of education: Not on file  . Highest education level: Not on file  Occupational History  . Not on file  Social Needs  . Financial resource strain: Not on file  . Food insecurity:    Worry: Not on file    Inability: Not on file  . Transportation needs:    Medical: Not on file    Non-medical: Not on file  Tobacco  Use  . Smoking status: Never Smoker  . Smokeless tobacco: Never Used  Substance and Sexual Activity  . Alcohol use: Yes    Comment: Rare- "once a month"  . Drug use: Not on file  . Sexual activity: Not on file  Lifestyle  . Physical activity:    Days per week: Not on file    Minutes per session: Not on file  . Stress: Not on file  Relationships  . Social connections:    Talks on phone: Not on file    Gets together: Not on file    Attends religious service: Not on file    Active member of club or organization: Not on file    Attends meetings of clubs or organizations: Not on file    Relationship status: Not on file  . Intimate partner violence:    Fear of current or ex partner: Not on file    Emotionally abused: Not on file    Physically abused: Not on file    Forced sexual activity: Not on file  Other Topics Concern  . Not on file  Social History Narrative  . Not on file    Past Medical History, Surgical history, Social history, and Family history were reviewed and updated as appropriate.   Please see review of systems for further details on the patient's review from today.   Objective:   Physical Exam:  BP 110/68   Pulse 75   Physical Exam Constitutional:      General: She is not in acute distress.    Appearance: She is well-developed.  Musculoskeletal:        General: No deformity.  Neurological:     Mental Status: She is alert and oriented to person, place, and time.     Coordination: Coordination normal.  Psychiatric:        Mood and Affect: Mood is anxious. Affect is not labile, blunt, angry or inappropriate.        Speech: Speech normal.        Behavior: Behavior normal.        Thought Content: Thought content normal. Thought content does not include homicidal or suicidal ideation. Thought content does not include homicidal or suicidal plan.        Judgment: Judgment normal.     Comments: Dysphoric mood Insight intact. No auditory or visual  hallucinations. No delusions.      Lab Review:     Component Value Date/Time   NA 140 07/16/2018 1324   K 4.2 07/16/2018 1324   CL 102 07/16/2018 1324   CO2 24 07/16/2018 1324   GLUCOSE 82 07/16/2018 1324   BUN 15 07/16/2018 1324   CREATININE 0.88 07/16/2018 1324   CALCIUM 9.1 07/16/2018 1324   PROT 6.5 07/16/2018 1324   ALBUMIN 4.6 07/16/2018 1324   AST 11 07/16/2018 1324   ALT 9 07/16/2018 1324   ALKPHOS 37 (L) 07/16/2018 1324  BILITOT 0.3 07/16/2018 1324   GFRNONAA 82 07/16/2018 1324   GFRAA 95 07/16/2018 1324       Component Value Date/Time   WBC 5.7 07/16/2018 1324   RBC 4.10 07/16/2018 1324   HGB 13.1 07/16/2018 1324   HCT 38.8 07/16/2018 1324   PLT 186 07/16/2018 1324   MCV 95 07/16/2018 1324   MCH 32.0 07/16/2018 1324   MCHC 33.8 07/16/2018 1324   RDW 11.8 (L) 07/16/2018 1324   LYMPHSABS 2.0 07/16/2018 1324   EOSABS 0.2 07/16/2018 1324   BASOSABS 0.0 07/16/2018 1324    No results found for: POCLITH, LITHIUM   Lab Results  Component Value Date   VALPROATE 77 07/16/2018     .res Assessment: Plan:   Discussed trial of increased dose of Trintellix since patient reports period of improved signs and symptoms with Trintellix 5 mg daily.  Will restart Trintellix 5 mg daily for 1 week and then increase to 10 mg daily for depression and anxiety. Continue Lamictal 100 mg daily for mood Continue Depakote ER 1000 p.o. nightly for mood.  Episodic mood disorder (HCC) - Chronic with a brief period of improved depressive signs and symptoms after starting Trintellix - Plan: vortioxetine HBr (TRINTELLIX) 5 MG TABS tablet  Anxiety disorder, unspecified type - Chronic with recent worsening - Plan: vortioxetine HBr (TRINTELLIX) 5 MG TABS tablet  Primary insomnia - Chronic - Plan: zaleplon (SONATA) 10 MG capsule  Please see After Visit Summary for patient specific instructions.  No future appointments.  No orders of the defined types were placed in this  encounter.     -------------------------------

## 2018-09-05 MED FILL — ZALEPLON 10 MG CAPSULE: 10 | 30 days supply | Qty: 30 | Fill #0

## 2018-09-24 ENCOUNTER — Other Ambulatory Visit: Payer: Self-pay | Admitting: Psychiatry

## 2018-09-24 DIAGNOSIS — F5101 Primary insomnia: Secondary | ICD-10-CM

## 2018-09-25 MED FILL — ALPRAZolam 0.25 MG TABS: 0.25 | 30 days supply | Qty: 30 | Fill #0

## 2018-09-25 NOTE — Telephone Encounter (Signed)
Just wanted to confirm still taking? Didn't see listed in last office visit.

## 2018-10-03 MED FILL — ZALEPLON 10 MG CAPSULE: 10 | 30 days supply | Qty: 30 | Fill #1

## 2018-10-10 ENCOUNTER — Telehealth: Payer: Self-pay | Admitting: Psychiatry

## 2018-10-10 NOTE — Telephone Encounter (Signed)
Left voice mail to call back 

## 2018-10-10 NOTE — Telephone Encounter (Signed)
Pt called having a lot of headaches from sleep meds. Next appt availiable 3/20. Pt would like to talk with you. Put on canc list.

## 2018-10-15 NOTE — Telephone Encounter (Signed)
Left voice mail to call back 

## 2018-10-20 MED FILL — DIVALPROEX SOD ER 500 MG TA: 500 | 90 days supply | Qty: 180 | Fill #1

## 2018-10-20 MED FILL — SUBVENITE 100 MG TABS: 100 | 90 days supply | Qty: 90 | Fill #1

## 2018-10-24 ENCOUNTER — Ambulatory Visit: Payer: No Typology Code available for payment source | Admitting: Psychiatry

## 2018-11-13 ENCOUNTER — Other Ambulatory Visit: Payer: Self-pay | Admitting: Psychiatry

## 2018-11-13 DIAGNOSIS — F5101 Primary insomnia: Secondary | ICD-10-CM

## 2018-11-13 MED FILL — ALPRAZolam 0.25 MG TABS: 0.25 | 30 days supply | Qty: 30 | Fill #0

## 2018-11-13 NOTE — Telephone Encounter (Signed)
JC

## 2018-12-12 ENCOUNTER — Encounter: Payer: Self-pay | Admitting: Psychiatry

## 2018-12-12 ENCOUNTER — Ambulatory Visit (INDEPENDENT_AMBULATORY_CARE_PROVIDER_SITE_OTHER): Payer: No Typology Code available for payment source | Admitting: Psychiatry

## 2018-12-12 ENCOUNTER — Other Ambulatory Visit: Payer: Self-pay

## 2018-12-12 DIAGNOSIS — F419 Anxiety disorder, unspecified: Secondary | ICD-10-CM

## 2018-12-12 DIAGNOSIS — F5101 Primary insomnia: Secondary | ICD-10-CM

## 2018-12-12 DIAGNOSIS — F39 Unspecified mood [affective] disorder: Secondary | ICD-10-CM | POA: Diagnosis not present

## 2018-12-12 MED ORDER — ALPRAZOLAM 0.25 MG PO TABS: ORAL_TABLET | ORAL | 2 refills | Status: DC

## 2018-12-12 MED ORDER — MIRTAZAPINE 15 MG PO TABS: ORAL_TABLET | ORAL | 0 refills | Status: DC

## 2018-12-12 MED FILL — ALPRAZolam 0.25 MG TABS: 0.25 | 30 days supply | Qty: 60 | Fill #0

## 2018-12-12 MED FILL — MIRTAZAPINE 15 MG TABLET: 15 | 30 days supply | Qty: 30 | Fill #0

## 2018-12-12 NOTE — Progress Notes (Signed)
Cindy Woods [id number] 12/03/1975 43 y.o.  Virtual Visit via Telephone Note  I connected with pt on 12/12/18 at  2:30 PM EDT by telephone and verified that I am speaking with the correct person using two identifiers.   I discussed the limitations, risks, security and privacy concerns of performing an evaluation and management service by telephone and the availability of in person appointments. I also discussed with the patient that there may be a patient responsible charge related to this service. The patient expressed understanding and agreed to proceed.   I discussed the assessment and treatment plan with the patient. The patient was provided an opportunity to ask questions and all were answered. The patient agreed with the plan and demonstrated an understanding of the instructions.   The patient was advised to call back or seek an in-person evaluation if the symptoms worsen or if the condition fails to improve as anticipated.  I provided 30 minutes of non-face-to-face time during this encounter.  The patient was located at home.  The provider was located at home.   [name], PMHNP   Subjective:   [name] is a 43 y.o. (DOB 05/03/1976) female.  Chief Complaint:  Chief Complaint  Patient presents with  . Anxiety  . Insomnia    HPI Cindy Woods presents for follow-up of anxiety and insomnia.  She reports, "I'm absolutely exhausted and need something for anxiety." She reports that she has been having difficulty with sleep and some nights is awake until 2 am. Reports she is in the process of buying a house and that has caused her some anxiety. Denies panic attacks. Denies anxious thoughts- "more of an angst... a negative euphoria." Has had some irritability. Denies impulsive behavior. Reports that after work she is "beyond exhaustion" and then has trouble sleeping. Appetite has been decreased. Reports concentration has been chronically impaired. Denies sad mood.  Describes situational anxiety. Reports difficulty getting out of bed and is frequently late for work.   Reports work has been fine. Denies SI.  Reports that she stopped Trintellix due to no significant improvement.  "I seem to need higher doses of Xanax to fall asleep." Reports taking two Xanax 0.25 mg most nights. No longer taking Sonata due to excessive somnolence.   Past Psychiatric Medication Trials: Seroquel Viibryd Wellbutrin Klonopin Latuda Abilify Lithium Zoloft Effexor Celexa Paxil BuSpar Lexapro Nuvigil Sonata- Was no longer effective and then was having to take more. Xanax Rexulti-restlessness Ativan Hydroxyzine Trintellix Temazepam Lamictal Depakote Trazodone  Thinks she may have taken Remeron.  Review of Systems:  Review of Systems  Gastrointestinal: Positive for nausea.       Reports nausea last night with severe anxiety.   Musculoskeletal: Negative for gait problem.  Neurological: Negative for tremors.  Psychiatric/Behavioral:       Please refer to HPI    Medications: I have reviewed the patient's current medications.  Current Outpatient Medications  Medication Sig Dispense Refill  . ALPRAZolam (XANAX) 0.25 MG tablet Take 1-2 po QHS 60 tablet 2  . divalproex (DEPAKOTE ER) 500 MG 24 hr tablet Take 2 tablets (1,000 mg total) by mouth daily. 180 tablet 1  . lamoTRIgine (LAMICTAL) 100 MG tablet Take 1 tablet (100 mg total) by mouth daily. 90 tablet 1  . sertraline (ZOLOFT) 50 MG tablet Take 1/2 tab po qd x 7-10 days, then increase to 1 tab po qd 30 tablet 0   No current facility-administered medications for this visit.  Medication Side Effects: None  Allergies: No Known Allergies  Past Medical History:  Diagnosis Date  . Bipolar II disorder ([place]) 06/15/2018    Family History  Problem Relation Age of Onset  . Depression Sister   . Anxiety disorder Brother     Social History   Socioeconomic History  . Marital status: Unknown     Spouse name: Not on file  . Number of children: Not on file  . Years of education: Not on file  . Highest education level: Not on file  Occupational History  . Not on file  Social Needs  . Financial resource strain: Not on file  . Food insecurity:    Worry: Not on file    Inability: Not on file  . Transportation needs:    Medical: Not on file    Non-medical: Not on file  Tobacco Use  . Smoking status: Never Smoker  . Smokeless tobacco: Never Used  Substance and Sexual Activity  . Alcohol use: Yes    Comment: Rare- "once a month"  . Drug use: Not on file  . Sexual activity: Not on file  Lifestyle  . Physical activity:    Days per week: Not on file    Minutes per session: Not on file  . Stress: Not on file  Relationships  . Social connections:    Talks on phone: Not on file    Gets together: Not on file    Attends religious service: Not on file    Active member of club or organization: Not on file    Attends meetings of clubs or organizations: Not on file    Relationship status: Not on file  . Intimate partner violence:    Fear of current or ex partner: Not on file    Emotionally abused: Not on file    Physically abused: Not on file    Forced sexual activity: Not on file  Other Topics Concern  . Not on file  Social History Narrative  . Not on file    Past Medical History, Surgical history, Social history, and Family history were reviewed and updated as appropriate.   Please see review of systems for further details on the patient's review from today.   Objective:   Physical Exam:  There were no vitals taken for this visit.  Physical Exam Neurological:     Mental Status: She is alert and oriented to person, place, and time.     Cranial Nerves: No dysarthria.  Psychiatric:        Attention and Perception: Attention normal.        Mood and Affect: Mood is anxious.        Speech: Speech normal.        Behavior: Behavior is cooperative.        Thought Content:  Thought content normal. Thought content is not paranoid or delusional. Thought content does not include homicidal or suicidal ideation. Thought content does not include homicidal or suicidal plan.        Cognition and Memory: Cognition and memory normal.        Judgment: Judgment normal.     Lab Review:     Component Value Date/Time   NA 140 07/16/2018 1324   K 4.2 07/16/2018 1324   CL 102 07/16/2018 1324   CO2 24 07/16/2018 1324   GLUCOSE 82 07/16/2018 1324   BUN 15 07/16/2018 1324   CREATININE 0.88 07/16/2018 1324   CALCIUM 9.1 07/16/2018 1324  PROT 6.5 07/16/2018 1324   ALBUMIN 4.6 07/16/2018 1324   AST 11 07/16/2018 1324   ALT 9 07/16/2018 1324   ALKPHOS 37 (L) 07/16/2018 1324   BILITOT 0.3 07/16/2018 1324   GFRNONAA 82 07/16/2018 1324   GFRAA 95 07/16/2018 1324       Component Value Date/Time   WBC 5.7 07/16/2018 1324   RBC 4.10 07/16/2018 1324   HGB 13.1 07/16/2018 1324   HCT 38.8 07/16/2018 1324   PLT 186 07/16/2018 1324   MCV 95 07/16/2018 1324   MCH 32.0 07/16/2018 1324   MCHC 33.8 07/16/2018 1324   RDW 11.8 (L) 07/16/2018 1324   LYMPHSABS 2.0 07/16/2018 1324   EOSABS 0.2 07/16/2018 1324   BASOSABS 0.0 07/16/2018 1324    No results found for: POCLITH, LITHIUM   Lab Results  Component Value Date   VALPROATE 77 07/16/2018     .res Assessment: Plan:   Discussed several treatment options with patient to include potential benefits, risks, and side effects of Remeron and sertraline.  Discussed that Remeron may improve sleep and also help with anxiety and depression.  Discussed plan of starting low-dose sertraline if patient is unable to tolerate Remeron since patient reports that she recalls sertraline being well-tolerated and effective in the past.  Will start Remeron 15 mg 1/2-1 p.o. nightly. Will continue Depakote and Lamictal for mood stabilization. Continue alprazolam 0.25 mg 1-2 tabs p.o. nightly for anxiety and insomnia. Patient to follow-up in 4  weeks or sooner if clinically indicated. Patient advised to contact office with any questions, adverse effects, or acute worsening in signs and symptoms.   Anxiety disorder, unspecified type - Plan: ALPRAZolam (XANAX) 0.25 MG tablet, sertraline (ZOLOFT) 50 MG tablet, DISCONTINUED: mirtazapine (REMERON) 15 MG tablet  Primary insomnia - Plan: ALPRAZolam (XANAX) 0.25 MG tablet, DISCONTINUED: mirtazapine (REMERON) 15 MG tablet  Episodic mood disorder (HCC) - Plan: sertraline (ZOLOFT) 50 MG tablet  Please see After Visit Summary for patient specific instructions.  No future appointments.  No orders of the defined types were placed in this encounter.     -------------------------------

## 2018-12-16 ENCOUNTER — Telehealth: Payer: Self-pay | Admitting: Psychiatry

## 2018-12-16 MED ORDER — SERTRALINE HCL 50 MG PO TABS: ORAL_TABLET | ORAL | 0 refills | Status: DC

## 2018-12-16 NOTE — Telephone Encounter (Signed)
Patient stated that the new medication is making her way too tired sleeping too long, exhausted all day she would like for you to send something else to the pharmacy.  Pt will schedule when she gets her schedule in June

## 2018-12-16 NOTE — Telephone Encounter (Signed)
Script sent for sertraline as we discussed during last visit in case new medication was not well tolerated.

## 2018-12-17 ENCOUNTER — Encounter

## 2018-12-17 ENCOUNTER — Ambulatory Visit: Payer: No Typology Code available for payment source | Admitting: Psychiatry

## 2018-12-17 MED ORDER — LAMOTRIGINE 100 MG PO TABS: 100.0000 mg | ORAL_TABLET | Freq: Every day | ORAL | 1 refills | Status: DC

## 2018-12-17 MED ORDER — DIVALPROEX SODIUM ER 500 MG PO TB24: 1000.0000 mg | ORAL_TABLET | Freq: Every day | ORAL | 1 refills | Status: DC

## 2019-01-12 MED FILL — SERTRALINE HCL 50 MG TABS: 50 | 35 days supply | Qty: 30 | Fill #0

## 2019-01-16 ENCOUNTER — Telehealth: Payer: Self-pay | Admitting: Psychiatry

## 2019-01-16 NOTE — Telephone Encounter (Signed)
Cindy Woods called to report that the medicines are not really helping her and she is having a hard time.  She would like  a review of her meds. And was wondering if you review them with Dr. Clovis Pu.  She made an appt. For 7/10.  But would like a call in the meantime to see if there are any adjustments you think would help. Because of her work it is best if you call after 5:00pm.  She also would like to know which counselor you recommend for her.

## 2019-01-20 NOTE — Telephone Encounter (Signed)
Left a VM for pt. To return my call.

## 2019-01-22 ENCOUNTER — Encounter: Payer: Self-pay | Admitting: Psychiatry

## 2019-01-22 ENCOUNTER — Ambulatory Visit (INDEPENDENT_AMBULATORY_CARE_PROVIDER_SITE_OTHER): Payer: No Typology Code available for payment source | Admitting: Psychiatry

## 2019-01-22 ENCOUNTER — Other Ambulatory Visit: Payer: Self-pay

## 2019-01-22 DIAGNOSIS — F419 Anxiety disorder, unspecified: Secondary | ICD-10-CM | POA: Diagnosis not present

## 2019-01-22 DIAGNOSIS — F5101 Primary insomnia: Secondary | ICD-10-CM

## 2019-01-22 MED ORDER — FANAPT 1 MG PO TABS: 1.0000 mg | ORAL_TABLET | Freq: Every day | ORAL | 0 refills | Status: DC

## 2019-01-22 NOTE — Progress Notes (Signed)
Cindy Woods [id number] 1975/09/26 43 y.o.  Subjective:   [name] is a 43 y.o. (DOB [birth year]) female.  Chief Complaint:  Chief Complaint  Patient presents with  . Anxiety  . Sleeping Problem    HPI Cindy Woods presents to the office today for follow-up of anxiety, mood, and insomnia.   She reports that she has negative effects with Xanax to include HA and dizziness the morning after she takes it. She reports that Xanax also seems to interfere with her thinking. Reports that she did not have significant anxiety when taking Xanax.  Reports that she did not need to take Xanax last week and noticed that she had increased anxiety, could not focus, and felt completely overwhelmed. Denies physical s/s of anxiety. Reports that she made an error at work when she did not take Xanax. Reports that this started after several days of not taking Xanax.   Took Xanax on Sunday and Monday night. She reports that she did not take Xanax Tuesday or Wednesday night. Reports that Sertraline caused her to feel more anxious and she stopped taking it after several days.  She reports that her sleep has been adequate the last 2 nights because she did not have to awaken for work. Reports adequate sleep when she does not have to work the following day. She reports that her anxiety is typically manageable when she is not at work.   Denies depresamountsed mood. She reports mood has been very irritable. Reports that her appetite has been low and food has not been as appealing. Reports that she continues to eat an adequate . She reports that energy and motivation have been lower when not taking Xanax. Denies SI.    Past Psychiatric Medication Trials: Seroquel Viibryd Wellbutrin Klonopin Latuda Abilify Lithium Zoloft-Adverse effects Effexor Celexa Paxil BuSpar Lexapro Nuvigil Sonata- Was no longer effective and then was having to take more. Reports that she has had some residual  grogginess the following day.  Xanax Rexulti-restlessness Ativan Hydroxyzine Trintellix Temazepam Mirtazapine- Excessive daytime somnolence.  Lamictal Depakote Trazodone  Has not taken Belsomra.    Review of Systems:  Review of Systems  Musculoskeletal: Negative for gait problem.  Neurological: Negative for tremors.  Psychiatric/Behavioral:       Please refer to HPI    Medications: I have reviewed the patient's current medications.  Current Outpatient Medications  Medication Sig Dispense Refill  . divalproex (DEPAKOTE ER) 500 MG 24 hr tablet Take 2 tablets (1,000 mg total) by mouth daily. 180 tablet 1  . lamoTRIgine (LAMICTAL) 100 MG tablet Take 1 tablet (100 mg total) by mouth daily. 90 tablet 1  . ALPRAZolam (XANAX) 0.25 MG tablet Take 1-2 po QHS (Patient not taking: Reported on 01/22/2019) 60 tablet 2  . Iloperidone (FANAPT) 1 MG TABS Take 1 tablet (1 mg total) by mouth at bedtime. 15 tablet 0   No current facility-administered medications for this visit.     Medication Side Effects: None  Allergies: No Known Allergies  Past Medical History:  Diagnosis Date  . Bipolar II disorder ([place]) 06/15/2018    Family History  Problem Relation Age of Onset  . Depression Sister   . Anxiety disorder Brother     Social History   Socioeconomic History  . Marital status: Unknown    Spouse name: Not on file  . Number of children: Not on file  . Years of education: Not on file  . Highest education level: Not on file  Occupational  History  . Not on file  Social Needs  . Financial resource strain: Not on file  . Food insecurity    Worry: Not on file    Inability: Not on file  . Transportation needs    Medical: Not on file    Non-medical: Not on file  Tobacco Use  . Smoking status: Never Smoker  . Smokeless tobacco: Never Used  Substance and Sexual Activity  . Alcohol use: Yes    Comment: Rare- "once a month"  . Drug use: Not on file  . Sexual activity: Not on  file  Lifestyle  . Physical activity    Days per week: Not on file    Minutes per session: Not on file  . Stress: Not on file  Relationships  . Social Herbalist on phone: Not on file    Gets together: Not on file    Attends religious service: Not on file    Active member of club or organization: Not on file    Attends meetings of clubs or organizations: Not on file    Relationship status: Not on file  . Intimate partner violence    Fear of current or ex partner: Not on file    Emotionally abused: Not on file    Physically abused: Not on file    Forced sexual activity: Not on file  Other Topics Concern  . Not on file  Social History Narrative  . Not on file    Past Medical History, Surgical history, Social history, and Family history were reviewed and updated as appropriate.   Please see review of systems for further details on the patient's review from today.   Objective:   Physical Exam:  There were no vitals taken for this visit.  Physical Exam Constitutional:      General: She is not in acute distress.    Appearance: She is well-developed.  Musculoskeletal:        General: No deformity.  Neurological:     Mental Status: She is alert and oriented to person, place, and time.     Coordination: Coordination normal.  Psychiatric:        Attention and Perception: Attention and perception normal. She does not perceive auditory or visual hallucinations.        Mood and Affect: Mood is anxious. Mood is not depressed. Affect is not labile, blunt, angry or inappropriate.        Speech: Speech normal.        Behavior: Behavior normal.        Thought Content: Thought content normal. Thought content does not include homicidal or suicidal ideation. Thought content does not include homicidal or suicidal plan.        Cognition and Memory: Cognition and memory normal.        Judgment: Judgment normal.     Comments: Insight intact. No delusions.  Irritable mood      Lab Review:     Component Value Date/Time   NA 140 07/16/2018 1324   K 4.2 07/16/2018 1324   CL 102 07/16/2018 1324   CO2 24 07/16/2018 1324   GLUCOSE 82 07/16/2018 1324   BUN 15 07/16/2018 1324   CREATININE 0.88 07/16/2018 1324   CALCIUM 9.1 07/16/2018 1324   PROT 6.5 07/16/2018 1324   ALBUMIN 4.6 07/16/2018 1324   AST 11 07/16/2018 1324   ALT 9 07/16/2018 1324   ALKPHOS 37 (L) 07/16/2018 1324   BILITOT 0.3 07/16/2018 1324  GFRNONAA 82 07/16/2018 1324   GFRAA 95 07/16/2018 1324       Component Value Date/Time   WBC 5.7 07/16/2018 1324   RBC 4.10 07/16/2018 1324   HGB 13.1 07/16/2018 1324   HCT 38.8 07/16/2018 1324   PLT 186 07/16/2018 1324   MCV 95 07/16/2018 1324   MCH 32.0 07/16/2018 1324   MCHC 33.8 07/16/2018 1324   RDW 11.8 (L) 07/16/2018 1324   LYMPHSABS 2.0 07/16/2018 1324   EOSABS 0.2 07/16/2018 1324   BASOSABS 0.0 07/16/2018 1324    No results found for: POCLITH, LITHIUM   Lab Results  Component Value Date   VALPROATE 77 07/16/2018     .res Assessment: Plan:   Case staffed with Dr. Clovis Pu. Discussed potential benefits, risks, and side effects of Fanapt for treatment of anxiety and insomnia.  Discussed that these are off label indications. Discussed potential metabolic side effects associated with atypical antipsychotics, as well as potential risk for movement side effects. Advised pt to contact office if movement side effects occur.  Will start Fanapt 1 mg p.o. nightly. Patient counseled regarding potential benefits of therapy and agrees to see therapist.  Patient scheduled to see [name], PhD. Patient to follow-up with this provider in 3 to 4 weeks or sooner if clinically indicated. Patient advised to contact office with any questions, adverse effects, or acute worsening in signs and symptoms. Recommend continuing Depakote and lamotrigine as prescribed. Jaiyanna was seen today for anxiety and sleeping problem.  Diagnoses and all orders for  this visit:  Primary insomnia -     Iloperidone (FANAPT) 1 MG TABS; Take 1 tablet (1 mg total) by mouth at bedtime.  Anxiety disorder, unspecified type -     Iloperidone (FANAPT) 1 MG TABS; Take 1 tablet (1 mg total) by mouth at bedtime.     Please see After Visit Summary for patient specific instructions.  Future Appointments  Date Time Provider [place]  02/04/2019  5:00 PM Blanchie Serve, PhD [facility] None  02/13/2019 10:00 AM [name], PMHNP [facility] None    No orders of the defined types were placed in this encounter.   -------------------------------

## 2019-01-26 MED FILL — SUBVENITE 100 MG TABS: 100 | 90 days supply | Qty: 90 | Fill #0

## 2019-01-26 MED FILL — DIVALPROEX SOD ER 500 MG TA: 500 | 90 days supply | Qty: 180 | Fill #0

## 2019-01-29 ENCOUNTER — Telehealth: Payer: Self-pay | Admitting: Psychiatry

## 2019-01-29 NOTE — Telephone Encounter (Signed)
Patient called and said that she needs a script of the fanapt 1 mg sent [facility].She will run out of samples and is going on vacation right after she sees you on the 7/10 and will not be able to pick up the medicine. She says the medicine does give her dry mouth. Please call her and discuss with her what you want to do. If you are sure we have samples you can give her another pack of samples

## 2019-01-30 NOTE — Telephone Encounter (Signed)
Left voice mail to call back 

## 2019-02-04 ENCOUNTER — Other Ambulatory Visit: Payer: Self-pay

## 2019-02-04 ENCOUNTER — Ambulatory Visit (INDEPENDENT_AMBULATORY_CARE_PROVIDER_SITE_OTHER): Payer: No Typology Code available for payment source | Admitting: Psychiatry

## 2019-02-04 DIAGNOSIS — R69 Illness, unspecified: Secondary | ICD-10-CM | POA: Diagnosis not present

## 2019-02-04 DIAGNOSIS — F5101 Primary insomnia: Secondary | ICD-10-CM | POA: Diagnosis not present

## 2019-02-04 DIAGNOSIS — F4010 Social phobia, unspecified: Secondary | ICD-10-CM | POA: Diagnosis not present

## 2019-02-04 DIAGNOSIS — F3181 Bipolar II disorder: Secondary | ICD-10-CM | POA: Diagnosis not present

## 2019-02-04 NOTE — Progress Notes (Signed)
PROBLEM-FOCUSED INITIAL PSYCHOTHERAPY EVALUATION Cindy Moore, PhD LP [facility]  Name: Cindy Woods Date: 02/04/2019 Time spent: 70 min MRN: [id number] DOB: 1975-08-11 Guardian/Payee: self  PCP: Hulan Fess, MD Documentation requested on this visit: No  PROBLEM HISTORY Reason for Visit /Presenting Problem:  Chief Complaint  Patient presents with  . Establish Care  . Anxiety   Narrative/History of Present Illness Referred by [name], PMHNP for treatment of anxiety, depression, and insomnia.  PT reports overarching, obsessive fear of losing her job, scheduled [place] in crisis.  Typically worries she will be judged on appearances, have things read in, be targeted for removal.  Now second-guessing herself about reactions with nursing supervisor recently.  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  Hx of voice teacher and a college choir chaperone each telling her she needed to look more happy/lively.  In nursing school, got a pointer to stand up straighter and uncross her arms.  2nd year, got written feedback that she comes across [place] -- "a scathing review" in her words.  Spiritually, finds she doesn't get this feedback when she feels closer to God, gets it when she doesn't.  Caught in a loop of thinking God will pitch her hardship or heartbreak any time as discipline for sin.    Acknowledges chronic insomnia, difficulty falling asleep,   "Addicted" to Xanax for sleep but wakes up sluggish, runs late routinely, worries it is the medication.  May take naps sometimes but wakes up depressed, worrisome again.    Acknowledges what she calls "OCD" -- sensitivity to a number of noises, both personal and otherwise (esp. father's noisy eating, sound of anyone breathing loudly, chewing gum, snoring).  Some smells also are highly provocative, e.g., chicken cooking, sometimes more so if she is not the one doing the cooking.    Re. moods and sense of fatalism, just  bought a house, has been reluctant to decorate.  Concerned that she is becoming more irritable/testy, e.g., with customer service people.  Feels restless, irritable, and guilty around a relationship she is in where she periodically hooks up for sex, which she finds very satisfying but then judges immoral of her.  Acknowledges hx of bipolar mood swings.  "Super depressed" early 19s, left grad school, seems to have been an anxiously intense time of self-judgment and perceived failure/undesirability.  Currently apprehensive about family interaction coming up soon and her own risk of feeling threatened and becoming furious with her family, father in particular.  Prior Psychiatric Assessment/Treatment:   Outpatient treatment history: Known to have seen other therapists, former medication management with Letta Moynahan, MD until her retirement.  Currently with [name], PMHNP of this practice. History of psychiatric hospitalization: Not assessed History of psychological assessment/testing: Not assessed   Abuse History: Victim of abuse: Not assessed at this time / none suspected.   Victim of neglect: No.   Perpetrator of abuse/neglect: No.   Witness / Exposure to Domestic Violence: Not assessed at this time / none suspected.   Witness to Community Violence:  Not assessed at this time / none suspected.   Protective Services Involvement: No.   Report needed: No.    Substance Abuse History: Current substance abuse: No.  Some prior concern for being both hypersensitive and dependent on benzodiazepine for sleep. History of impactful substance use/abuse: Not assessed at this time / none suspected.     FAMILY/SOCIAL HISTORY Family of origin -- PT reports father as negative influence, demanding of her to  excel in voice.  Perfectionistic expectations, never wrong, knee-jerk defensive, was same to her mother.   Current family -- PT lives alone, with living conditions reported as stable.   Self-reported sexual orientation: Straight, presently in a periodic hookup relationship.  Hx of relationships that do not work out.  No abuse suggested. Education/occupation -- college, nursing degree, working FT as Psychologist, prison and probation services --  Employment History of [employer] -- Not assessed at this time / none suspected.   Spiritually -- Protestant Christian.  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 Enjoyable activities -- Not assessed.  Other situational factors affecting treatment and prognosis: Stressors from the following areas: Marital or family conflict Occupational concerns Barriers to service: historical tendency to change providers, abandon course of treatment and/or treatment relationship  Notable cultural sensitivities: none stated Strengths: Self Advocate   MED/SURG HISTORY Med/surg history was not reviewed with PT at this time. Past Medical History:  Diagnosis Date  . Bipolar II disorder ([place]) 06/15/2018    No past surgical history on file.  No Known Allergies  Medications (as listed in Epic): Current Outpatient Medications  Medication Sig Dispense Refill  . ALPRAZolam (XANAX) 0.25 MG tablet Take 1-2 po QHS (Patient taking differently: Take 0.25-0.5 mg by mouth at bedtime. Take 1-2 po QHS) 60 tablet 2  . divalproex (DEPAKOTE ER) 500 MG 24 hr tablet Take 2 tablets (1,000 mg total) by mouth daily. 180 tablet 1  . Iloperidone (FANAPT) 1 MG TABS Take 1 tablet (1 mg total) by mouth at bedtime. 30 tablet 0  . lamoTRIgine (LAMICTAL) 100 MG tablet Take 1 tablet (100 mg total) by mouth daily. 90 tablet 1   No current facility-administered medications for this visit.     MENTAL STATUS AND OBSERVATIONS Appearance:   Casual and Well Groomed     Behavior:  Appropriate and Assertive  Motor:  Normal  Speech/Language:   Clear and Coherent and some impressionistic speech  Affect:  Appropriate, Full Range and mildly reactive  Mood:  anxious and irritable   Thought process:  normal and mild flight  Thought content:    WNL  Sensory/Perceptual disturbances:    reported sensory hypersensitivities  Orientation:  grossly intact  Attention:  Good  Concentration:  Good  Memory:  WNL  Fund of knowledge:   Good  Insight:    Good  Judgment:   Fair  Impulse Control:  Fair  Initial Risk Assessment: Danger to Self: No Self-injurious Behavior: No Danger to Others: No Physical Aggression / Violence: No Duty to Warn: No Access to Firearms a concern: No Gang Involvement: No Patient / guardian was educated about steps to take if suicide or homicide risk level increases between visits: yes . While future psychiatric events cannot be accurately predicted, the patient does not currently require acute inpatient psychiatric care and does not currently meet [facility] involuntary commitment criteria.   DIAGNOSIS:    ICD-10-CM   1. Bipolar II disorder ([place])  F31.81   2. Social anxiety disorder  F40.10   3. Primary insomnia  F51.01   4. Suspected condition  R69    r/o Histrionic traits    INITIAL TREATMENT: . Ethical orientation and verbal consents to o privacy rights including, but not limited to, HIPAA provisions and any questions, EMR and use of e-PHI o patient responsibilities, including scheduling and fair notice of changes, method of visit options and regulatory and financial conditions affecting them o expectations for working relationship in psychotherapy o expectations and  consents for working partnerships with other health care disciplines, especially including medication and other behavioral health providers . Support/validation for unwanted and traumatic events . Psychoeducation about panic and anxiety control therapy, oriented to basic breath control and diaphragmatic breathing . Spiritually, challenged to consider that God doesn't make bad things happen, certainly not as reactive punishment for our own misjudgment, but instead means to  show up when bad things do, though that may require [place] to quiet ourselves.  Reluctant to accept; challenged merely to consider it, and to let God's character be distinguishable form that of her father. . Support for capacity to step back and think in reacting to interpersonal situations.  Advised to doubt so much may be made of her nonverbals these days, trust current supervisor to think well enough of her so long as she does not try too hard to explain or control, for these will dispose her to reactivity. . Discussed upcoming events and availability for further counseling in advance of situations  Plan: . Practice any calming skills she knows, use diaphragmatic breathing ad lib to take edge off experienced anxiety whenever available . Maintain medication as prescribed and work faithfully with relevant prescriber(s) if any changes are desired or seem indicated . Call the clinic on-call service, present to ER, or call 911 if any life-threatening psychiatric crisis Return in about 1 week (around 02/11/2019) for will call.  Blanchie Serve, PhD  Cindy Moore, PhD LP Clinical Psychologist, [facility] [address], [place], [place] [address] [phone]

## 2019-02-11 ENCOUNTER — Other Ambulatory Visit: Payer: Self-pay

## 2019-02-11 ENCOUNTER — Ambulatory Visit (INDEPENDENT_AMBULATORY_CARE_PROVIDER_SITE_OTHER): Payer: No Typology Code available for payment source | Admitting: Psychiatry

## 2019-02-11 DIAGNOSIS — F4010 Social phobia, unspecified: Secondary | ICD-10-CM

## 2019-02-11 DIAGNOSIS — F3181 Bipolar II disorder: Secondary | ICD-10-CM

## 2019-02-11 DIAGNOSIS — R69 Illness, unspecified: Secondary | ICD-10-CM

## 2019-02-11 DIAGNOSIS — F5101 Primary insomnia: Secondary | ICD-10-CM

## 2019-02-11 NOTE — Progress Notes (Signed)
Psychotherapy Progress Note [facility] Cindy Moore, PhD LP  Patient ID: Cindy Woods     MRN: [id number]     Therapy format: Individual psychotherapy Date: 02/11/2019     Start: 6:05p Stop: 6:55p Time Spent: 50 min Location: in-person   Session narrative (presenting needs, interim history, self-report of stressors and symptoms, applications of prior therapy, status changes, and interventions made in session) Reports head much clearer with Fanapt, able to exercise after work, kept on a schedule, was easy to go to bed and feel like it 10pm, only needed 1/2 Xanax, not in obsessive alarm checking, as opposed to pattern for months of   "Made a terrible mistake" and saw [facility] Sunday for hookup, felt miserable Monday.  Today felt a "snap", a letup in dread and anxiety, accompanied by healthy appetite, calm, lack of irritability.  Affirmed her ability to rise from shame attack and self-recrimination, which seem to drive negative and/or agitated depressed moods.  Stubbornly shaming herself over it, offered that when she does this, she is simultaneously trying to reduce tension, feel less lonely, and feel desirable all at the same time, and it is OK to want those.  This weekend [name], husband, and 4 kids came over, noted a "big, big thing" that sister has their father's habit of yelling at the kids; PT felt trapped with it (sensitive reminder of father, plus the noise itself, plus feeling her own unwanted anger rising -- very threatening) -- enough to suppress appetite temporarily (and probably part of what led to hooking up -- sexually medicating her anxious/irritable mood).    Apprehensive this coming weekend, will be away with family on annual trip.  Including sister and family, brother and his girlfriend, PT and a friend Cindy Woods (to ride shotgun).  Appetite disappears with these family trips every year due to tension/anxiety  1:1 time planned with Cindy Woods to buffer perceived  overexposure to family.  Knows a number of things grate on her nerves, including some sounds and merely being packed in the space together.  Car ride can be grueling.  Discussed possible tactics for pacing exposure, encouraged stay open to things that may go well despite expectations, and took up assertive communication -- if actually needed -- for foreseeable conflict.  Therapeutic modalities: Cognitive Behavioral Therapy, Assertiveness/Communication, Mindfulness Meditation and Solution-Oriented/Positive Psychology  Mental Status/Observations:  Appearance:   Casual     Behavior:  Appropriate and mildly agitated  Motor:  Normal  Speech/Language:   Clear and Coherent  Affect:  Appropriate  Mood:  anxious, irritable and mildly; repsonsive to feedback  Thought process:  normal  Thought content:    WNL  Sensory/Perceptual disturbances:    WNL  Orientation:  grossly intact  Attention:  Good  Concentration:  Good  Memory:  WNL  Insight:    Good  Judgment:   Good  Impulse Control:  Fair   Risk Assessment: Danger to Self: No Self-injurious Behavior: No Danger to Others: No Physical Aggression / Violence: No Duty to Warn: No Access to Firearms a concern: No  Assessment of progress:  progressing  Diagnosis:   ICD-10-CM   1. Bipolar II disorder ([place])  F31.81   2. Social anxiety disorder  F40.10   3. Suspected condition  R69    sensory hypersensitivity    Plan:  . Imagine better encounters with family and positive coping for when they don't happen . Other recommendations/advice as noted above . Come back to sleep measures when able --  will be important for reducing sensory and emotional sensitivities . Continue to utilize previously learned skills ad lib . Maintain medication as prescribed and work faithfully with relevant prescriber(s) if any changes are desired or seem indicated . Call the clinic on-call service, present to ER, or call 911 if any life-threatening psychiatric  crisis No follow-ups on file.   Cindy Serve, PhD Cindy Moore, PhD LP Clinical Psychologist, [facility] [address], [place], [place] [address] [phone]

## 2019-02-13 ENCOUNTER — Encounter: Payer: Self-pay | Admitting: Psychiatry

## 2019-02-13 ENCOUNTER — Ambulatory Visit (INDEPENDENT_AMBULATORY_CARE_PROVIDER_SITE_OTHER): Payer: No Typology Code available for payment source | Admitting: Psychiatry

## 2019-02-13 ENCOUNTER — Other Ambulatory Visit: Payer: Self-pay

## 2019-02-13 DIAGNOSIS — F419 Anxiety disorder, unspecified: Secondary | ICD-10-CM

## 2019-02-13 DIAGNOSIS — F5101 Primary insomnia: Secondary | ICD-10-CM

## 2019-02-13 MED ORDER — FANAPT 1 MG PO TABS: 1.0000 mg | ORAL_TABLET | Freq: Every day | ORAL | 0 refills | Status: DC

## 2019-02-13 NOTE — Progress Notes (Signed)
Cindy Woods [id number] January 09, 1976 43 y.o.  Subjective:   [name] is a 43 y.o. (DOB 03-15-1976) female.  Chief Complaint:  Chief Complaint  Patient presents with  . Anxiety  . Sleeping Problem    HPI Cindy Woods presents to the office today for follow-up of anxiety and insomnia. She reports "last week was a good week... this week was not as good." She reports that last week she felt "my head was clearer" and was able to exercise after work and had one day without any difficulty getting up. Reports energy and motivation were better last week. Reports that she was able to fall asleep quickly and continued to use Xanax 1/2 tab po QHS. She reports that she slept excessively one night this week and then felt overly tired. Reports that she has been feeling "lethargic." Reports this week energy and motivation have been lower.   She reports that she had anxiety over the weekend due to family being in town. Reports that being around family typically triggers anxiety. She reports that she has "paranoid thoughts" such as thinking that she will be penalized for doing something. Reports thinking at times family did certain things on purpose. She reports that she has had these types of thoughts in the past and has noticed it more in the last week. Reports that she feels she has to continue to take Xanax to improve anxiety about not falling asleep before work. Reports that she may have gotten more sleep last week.   She reports that her mood was "really good last week." Denies depressed mood. Reports continued irritability. Reports that irritability was less last week and has more irritability in certain situations. She reports that she is easily irritated by certain sounds and triggers. Reports that concentration was better last week and not as good this week. Denies SI.   Reports that when she started Fanapt she felt as if her heart was "beating really hard" but not racing. She reports  that she also has had dry mouth initially. Reports that these side effects subsided.  She reports that she has been gaining weight and estimates gaining 3 lbs in a week. Reports that she has been eating more.   Past Psychiatric Medication Trials: Seroquel Viibryd Wellbutrin Klonopin Latuda Abilify Lithium Zoloft-Adverse effects Effexor Celexa Paxil BuSpar Lexapro Nuvigil Sonata- Was no longer effective and then was having to take more. Reports that she has had some residual grogginess the following day.  Xanax Rexulti-restlessness Ativan Hydroxyzine Trintellix Temazepam Mirtazapine- Excessive daytime somnolence.  Lamictal Depakote Trazodone  Review of Systems:  Review of Systems  Gastrointestinal:       Reports periodic "twitching in my stomach" and reports that this occurred prior to starting Fanapt  Musculoskeletal: Negative for gait problem.  Neurological: Negative for tremors.  Psychiatric/Behavioral:       Please refer to HPI    Medications: I have reviewed the patient's current medications.  Current Outpatient Medications  Medication Sig Dispense Refill  . ALPRAZolam (XANAX) 0.25 MG tablet Take 1-2 po QHS 60 tablet 2  . divalproex (DEPAKOTE ER) 500 MG 24 hr tablet Take 2 tablets (1,000 mg total) by mouth daily. 180 tablet 1  . Iloperidone (FANAPT) 1 MG TABS Take 1 tablet (1 mg total) by mouth at bedtime. 30 tablet 0  . lamoTRIgine (LAMICTAL) 100 MG tablet Take 1 tablet (100 mg total) by mouth daily. 90 tablet 1  . Melatonin 10 MG TABS Take by mouth.  No current facility-administered medications for this visit.     Medication Side Effects: Other: Possible weight gain  Allergies: No Known Allergies  Past Medical History:  Diagnosis Date  . Bipolar II disorder ([place]) 06/15/2018    Family History  Problem Relation Age of Onset  . Depression Sister   . Anxiety disorder Brother     Social History   Socioeconomic History  . Marital status:  Unknown    Spouse name: Not on file  . Number of children: Not on file  . Years of education: Not on file  . Highest education level: Not on file  Occupational History  . Not on file  Social Needs  . Financial resource strain: Not on file  . Food insecurity    Worry: Not on file    Inability: Not on file  . Transportation needs    Medical: Not on file    Non-medical: Not on file  Tobacco Use  . Smoking status: Never Smoker  . Smokeless tobacco: Never Used  Substance and Sexual Activity  . Alcohol use: Yes    Comment: Rare- "once a month"  . Drug use: Not on file  . Sexual activity: Not on file  Lifestyle  . Physical activity    Days per week: Not on file    Minutes per session: Not on file  . Stress: Not on file  Relationships  . Social Herbalist on phone: Not on file    Gets together: Not on file    Attends religious service: Not on file    Active member of club or organization: Not on file    Attends meetings of clubs or organizations: Not on file    Relationship status: Not on file  . Intimate partner violence    Fear of current or ex partner: Not on file    Emotionally abused: Not on file    Physically abused: Not on file    Forced sexual activity: Not on file  Other Topics Concern  . Not on file  Social History Narrative  . Not on file    Past Medical History, Surgical history, Social history, and Family history were reviewed and updated as appropriate.   Please see review of systems for further details on the patient's review from today.   Objective:   Physical Exam:  There were no vitals taken for this visit.  Physical Exam Constitutional:      General: She is not in acute distress.    Appearance: She is well-developed.  Musculoskeletal:        General: No deformity.  Neurological:     Mental Status: She is alert and oriented to person, place, and time.     Coordination: Coordination normal.  Psychiatric:        Attention and  Perception: Attention and perception normal. She does not perceive auditory or visual hallucinations.        Mood and Affect: Mood is anxious. Mood is not depressed. Affect is not labile, blunt, angry or inappropriate.        Speech: Speech normal.        Behavior: Behavior normal.        Thought Content: Thought content normal. Thought content does not include homicidal or suicidal ideation. Thought content does not include homicidal or suicidal plan.        Cognition and Memory: Cognition and memory normal.        Judgment: Judgment normal.  Comments: Dysphoric mood Insight intact. No delusions.      Lab Review:     Component Value Date/Time   NA 140 07/16/2018 1324   K 4.2 07/16/2018 1324   CL 102 07/16/2018 1324   CO2 24 07/16/2018 1324   GLUCOSE 82 07/16/2018 1324   BUN 15 07/16/2018 1324   CREATININE 0.88 07/16/2018 1324   CALCIUM 9.1 07/16/2018 1324   PROT 6.5 07/16/2018 1324   ALBUMIN 4.6 07/16/2018 1324   AST 11 07/16/2018 1324   ALT 9 07/16/2018 1324   ALKPHOS 37 (L) 07/16/2018 1324   BILITOT 0.3 07/16/2018 1324   GFRNONAA 82 07/16/2018 1324   GFRAA 95 07/16/2018 1324       Component Value Date/Time   WBC 5.7 07/16/2018 1324   RBC 4.10 07/16/2018 1324   HGB 13.1 07/16/2018 1324   HCT 38.8 07/16/2018 1324   PLT 186 07/16/2018 1324   MCV 95 07/16/2018 1324   MCH 32.0 07/16/2018 1324   MCHC 33.8 07/16/2018 1324   RDW 11.8 (L) 07/16/2018 1324   LYMPHSABS 2.0 07/16/2018 1324   EOSABS 0.2 07/16/2018 1324   BASOSABS 0.0 07/16/2018 1324    No results found for: POCLITH, LITHIUM   Lab Results  Component Value Date   VALPROATE 77 07/16/2018     .res Assessment: Plan:   Discussed treatment options with patient.  And agreed that more time to determine response to [employer] would be helpful since patient noticed 1 week where her mood, anxiety, and sleep were significantly improved, and then she experienced some recurrence of symptoms around the time of a  situational stressor. Discussed that Fanapt has been better tolerated compared to other past medication trials and has had some benefit and will therefore continue current dose. Recommended not increasing the dose at this time since pt reports that she has felt more tired the last few days and it is unclear if this is related to [place] or disrupted sleep with situational stressor.  Continue Fanapt 1 mg po QHS for anxiety, insomnia, and mood stabilization. Continue Xanax prn Continue Depakote and Lamictal for mood stabilization. Recommend continuing psychotherapy with [name], PhD. Pt to f/u with this provider in 4 weeks or sooner if clinically indicated.  Patient advised to contact office with any questions, adverse effects, or acute worsening in signs and symptoms.  Margrete was seen today for anxiety and sleeping problem.  Diagnoses and all orders for this visit:  Primary insomnia -     Iloperidone (FANAPT) 1 MG TABS; Take 1 tablet (1 mg total) by mouth at bedtime.  Anxiety disorder, unspecified type -     Iloperidone (FANAPT) 1 MG TABS; Take 1 tablet (1 mg total) by mouth at bedtime.     Please see After Visit Summary for patient specific instructions.  Future Appointments  Date Time Provider [place]  02/25/2019  6:00 PM Blanchie Serve, PhD CP-CP None    No orders of the defined types were placed in this encounter.   -------------------------------

## 2019-02-17 ENCOUNTER — Telehealth: Payer: Self-pay

## 2019-02-17 MED FILL — FANAPT 1 MG TABLET: 1 | 30 days supply | Qty: 30 | Fill #0

## 2019-02-17 NOTE — Telephone Encounter (Signed)
Prior authorization approved for Fanapt 1 mg 1 daily effective 02/14/2019-02/13/2020 through [facility]

## 2019-02-25 ENCOUNTER — Ambulatory Visit (INDEPENDENT_AMBULATORY_CARE_PROVIDER_SITE_OTHER): Payer: No Typology Code available for payment source | Admitting: Psychiatry

## 2019-02-25 ENCOUNTER — Other Ambulatory Visit: Payer: Self-pay

## 2019-02-25 DIAGNOSIS — H93299 Other abnormal auditory perceptions, unspecified ear: Secondary | ICD-10-CM

## 2019-02-25 DIAGNOSIS — F4010 Social phobia, unspecified: Secondary | ICD-10-CM | POA: Diagnosis not present

## 2019-02-25 DIAGNOSIS — F3181 Bipolar II disorder: Secondary | ICD-10-CM | POA: Diagnosis not present

## 2019-02-25 DIAGNOSIS — F5101 Primary insomnia: Secondary | ICD-10-CM

## 2019-02-25 DIAGNOSIS — H93239 Hyperacusis, unspecified ear: Secondary | ICD-10-CM | POA: Diagnosis not present

## 2019-02-25 DIAGNOSIS — H93293 Other abnormal auditory perceptions, bilateral: Secondary | ICD-10-CM
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
## 2019-03-03 MED FILL — FANAPT 1 MG TABLET: 1 | 30 days supply | Qty: 30 | Fill #0

## 2019-03-07 ENCOUNTER — Emergency Department (HOSPITAL_COMMUNITY)
Admission: EM | Admit: 2019-03-07 | Discharge: 2019-03-07 | Disposition: A | Discharge status: Home / Self Care | Payer: No Typology Code available for payment source | Attending: Emergency Medicine | Admitting: Emergency Medicine

## 2019-03-07 ENCOUNTER — Emergency Department (HOSPITAL_COMMUNITY): Payer: No Typology Code available for payment source

## 2019-03-07 ENCOUNTER — Encounter (HOSPITAL_COMMUNITY): Payer: Self-pay

## 2019-03-07 ENCOUNTER — Other Ambulatory Visit: Payer: Self-pay

## 2019-03-07 DIAGNOSIS — Z1881 Retained glass fragments: Secondary | ICD-10-CM | POA: Diagnosis not present

## 2019-03-07 DIAGNOSIS — S161XXA Strain of muscle, fascia and tendon at neck level, initial encounter: Secondary | ICD-10-CM | POA: Diagnosis not present

## 2019-03-07 DIAGNOSIS — Z79899 Other long term (current) drug therapy: Secondary | ICD-10-CM | POA: Diagnosis not present

## 2019-03-07 DIAGNOSIS — Y92410 Unspecified street and highway as the place of occurrence of the external cause: Secondary | ICD-10-CM | POA: Diagnosis not present

## 2019-03-07 DIAGNOSIS — Y999 Unspecified external cause status: Secondary | ICD-10-CM | POA: Insufficient documentation

## 2019-03-07 DIAGNOSIS — R0789 Other chest pain: Secondary | ICD-10-CM | POA: Diagnosis not present

## 2019-03-07 DIAGNOSIS — S81822A Laceration with foreign body, left lower leg, initial encounter: Secondary | ICD-10-CM | POA: Insufficient documentation

## 2019-03-07 DIAGNOSIS — Y9389 Activity, other specified: Secondary | ICD-10-CM | POA: Insufficient documentation

## 2019-03-07 DIAGNOSIS — S199XXA Unspecified injury of neck, initial encounter: Secondary | ICD-10-CM | POA: Diagnosis present

## 2019-03-07 MED ORDER — IOHEXOL 300 MG/ML  SOLN
75.0000 mL | Freq: Once | INTRAMUSCULAR | Status: AC | PRN
  Administered 2019-03-07: 75 mL via INTRAVENOUS

## 2019-03-07 MED ORDER — LIDOCAINE-EPINEPHRINE (PF) 2 %-1:200000 IJ SOLN
10.0000 mL | Freq: Once | INTRAMUSCULAR | Status: AC
  Administered 2019-03-07: 10 mL
  Filled 2019-03-07: qty 10

## 2019-03-07 MED ORDER — SODIUM CHLORIDE (PF) 0.9 % IJ SOLN
INTRAMUSCULAR | Status: AC
  Administered 2019-03-07: 14:00:00
  Filled 2019-03-07: qty 50

## 2019-03-07 MED ORDER — CEPHALEXIN 500 MG PO CAPS: 500.0000 mg | ORAL_CAPSULE | Freq: Four times a day (QID) | ORAL | 0 refills | Status: AC
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
## 2019-03-11 MED FILL — IBUPROFEN 800 MG TAB: 800 | 30 days supply | Qty: 90 | Fill #0

## 2019-03-17 ENCOUNTER — Encounter

## 2019-03-17 ENCOUNTER — Ambulatory Visit: Payer: No Typology Code available for payment source | Admitting: Psychiatry

## 2019-03-20 MED FILL — predniSONE 10 MG (48) TBPK: 10 | 12 days supply | Qty: 48 | Fill #0

## 2019-03-27 ENCOUNTER — Other Ambulatory Visit: Payer: Self-pay | Admitting: Psychiatry

## 2019-03-27 DIAGNOSIS — F419 Anxiety disorder, unspecified: Secondary | ICD-10-CM

## 2019-03-27 DIAGNOSIS — F5101 Primary insomnia: Secondary | ICD-10-CM

## 2019-03-27 MED FILL — FANAPT 1 MG TABLET: 1 | 30 days supply | Qty: 30 | Fill #0

## 2019-03-27 MED FILL — ALPRAZolam 0.25 MG TABS: 0.25 | 30 days supply | Qty: 60 | Fill #1

## 2019-04-06 ENCOUNTER — Other Ambulatory Visit: Payer: Self-pay

## 2019-04-06 ENCOUNTER — Ambulatory Visit (INDEPENDENT_AMBULATORY_CARE_PROVIDER_SITE_OTHER): Payer: No Typology Code available for payment source | Admitting: Psychiatry

## 2019-04-06 DIAGNOSIS — F4010 Social phobia, unspecified: Secondary | ICD-10-CM | POA: Diagnosis not present

## 2019-04-06 DIAGNOSIS — H93299 Other abnormal auditory perceptions, unspecified ear: Secondary | ICD-10-CM

## 2019-04-06 DIAGNOSIS — R69 Illness, unspecified: Secondary | ICD-10-CM | POA: Diagnosis not present

## 2019-04-06 DIAGNOSIS — H93239 Hyperacusis, unspecified ear: Secondary | ICD-10-CM | POA: Diagnosis not present

## 2019-04-06 DIAGNOSIS — F3181 Bipolar II disorder: Secondary | ICD-10-CM
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
## 2019-04-23 MED FILL — DIVALPROEX SOD ER 500 MG TA: 500 | 90 days supply | Qty: 180 | Fill #1

## 2019-04-23 MED FILL — SUBVENITE 100 MG TABS: 100 | 90 days supply | Qty: 90 | Fill #1

## 2019-04-24 ENCOUNTER — Other Ambulatory Visit: Payer: Self-pay | Admitting: Psychiatry

## 2019-04-24 DIAGNOSIS — F5101 Primary insomnia: Secondary | ICD-10-CM

## 2019-04-24 DIAGNOSIS — F419 Anxiety disorder, unspecified: Secondary | ICD-10-CM

## 2019-04-25 MED FILL — FANAPT 1 MG TABLET: 1 | 30 days supply | Qty: 30 | Fill #0

## 2019-06-02 ENCOUNTER — Other Ambulatory Visit: Payer: Self-pay | Admitting: Psychiatry

## 2019-06-02 DIAGNOSIS — F5101 Primary insomnia: Secondary | ICD-10-CM

## 2019-06-02 DIAGNOSIS — F419 Anxiety disorder, unspecified: Secondary | ICD-10-CM

## 2019-06-03 MED FILL — FANAPT 1 MG TABLET: 1 | 30 days supply | Qty: 30 | Fill #0

## 2019-06-03 NOTE — Telephone Encounter (Signed)
Last visit July, nothing set up

## 2019-07-07 ENCOUNTER — Other Ambulatory Visit: Payer: Self-pay | Admitting: Psychiatry

## 2019-07-07 DIAGNOSIS — F419 Anxiety disorder, unspecified: Secondary | ICD-10-CM

## 2019-07-07 DIAGNOSIS — F5101 Primary insomnia: Secondary | ICD-10-CM

## 2019-07-07 MED FILL — FANAPT 1 MG TABLET: 1 | 30 days supply | Qty: 30 | Fill #0

## 2019-07-07 MED FILL — ALPRAZolam 0.25 MG TABS: 0.25 | 30 days supply | Qty: 60 | Fill #0

## 2019-07-07 NOTE — Telephone Encounter (Signed)
Last apt 07/02 Nothing scheduled was due for 4 week f/u

## 2019-07-27 ENCOUNTER — Other Ambulatory Visit: Payer: Self-pay | Admitting: Psychiatry

## 2019-07-27 DIAGNOSIS — F39 Unspecified mood [affective] disorder: Secondary | ICD-10-CM

## 2019-07-27 MED FILL — SUBVENITE 100 MG TABS: 100 | 90 days supply | Qty: 90 | Fill #0

## 2019-07-27 MED FILL — DIVALPROEX SOD ER 500 MG TA: 500 | 90 days supply | Qty: 180 | Fill #0

## 2019-07-27 NOTE — Telephone Encounter (Signed)
90 day ok? Last apt 02/2019 was due back 4 weeks

## 2019-08-10 ENCOUNTER — Other Ambulatory Visit: Payer: Self-pay | Admitting: Psychiatry

## 2019-08-10 DIAGNOSIS — F419 Anxiety disorder, unspecified: Secondary | ICD-10-CM

## 2019-08-10 DIAGNOSIS — F5101 Primary insomnia: Secondary | ICD-10-CM

## 2019-08-11 MED FILL — FANAPT 1 MG TABLET: 1 | 30 days supply | Qty: 30 | Fill #0

## 2019-08-27 ENCOUNTER — Ambulatory Visit (INDEPENDENT_AMBULATORY_CARE_PROVIDER_SITE_OTHER): Payer: No Typology Code available for payment source | Admitting: Psychiatry

## 2019-08-27 ENCOUNTER — Encounter: Payer: Self-pay | Admitting: Psychiatry

## 2019-08-27 VITALS — BP 100/60 | Wt 145.0 lb

## 2019-08-27 DIAGNOSIS — Z79899 Other long term (current) drug therapy: Secondary | ICD-10-CM | POA: Diagnosis not present

## 2019-08-27 DIAGNOSIS — F419 Anxiety disorder, unspecified: Secondary | ICD-10-CM

## 2019-08-27 DIAGNOSIS — F39 Unspecified mood [affective] disorder: Secondary | ICD-10-CM | POA: Diagnosis not present

## 2019-08-27 DIAGNOSIS — F5101 Primary insomnia: Secondary | ICD-10-CM

## 2019-08-27 MED ORDER — LAMOTRIGINE 100 MG PO TABS: ORAL_TABLET | ORAL | 1 refills | Status: DC

## 2019-08-27 MED ORDER — DIVALPROEX SODIUM ER 500 MG PO TB24: 1000.0000 mg | ORAL_TABLET | Freq: Every day | ORAL | 1 refills | Status: DC

## 2019-08-27 MED ORDER — ALPRAZOLAM 0.25 MG PO TABS: ORAL_TABLET | ORAL | 1 refills | Status: DC

## 2019-08-27 MED ORDER — FANAPT 1 MG PO TABS: 1.0000 | ORAL_TABLET | Freq: Every day | ORAL | 1 refills | Status: DC

## 2019-08-27 MED FILL — ALPRAZolam 0.25 MG TABS: 0.25 | 30 days supply | Qty: 60 | Fill #0
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
## 2019-09-08 MED FILL — FANAPT 1 MG TABLET: 1 | 90 days supply | Qty: 90 | Fill #0

## 2019-09-14 MED FILL — ALPRAZolam 0.25 MG TABS: 0.25 | 30 days supply | Qty: 60 | Fill #0

## 2019-10-05 MED FILL — LINZESS 145 MCG CAPSULE: 145 | 30 days supply | Qty: 30 | Fill #0

## 2019-10-20 ENCOUNTER — Telehealth: Payer: Self-pay | Admitting: Psychiatry

## 2019-10-20 NOTE — Telephone Encounter (Signed)
Left patient detailed message on her voicemail and to call back with information

## 2019-10-20 NOTE — Telephone Encounter (Signed)
Tried to reach patient again to verify receiving message and clarify needing any specifics for her FMLA intermittent, does she work 8 or 12 hour shifts, how many days a month was she thinking on average, anything else that might be helpful when filling out forms.  Also advised to increase Fanapt to 2 mg at hs

## 2019-10-20 NOTE — Telephone Encounter (Signed)
Pt returned call. Stated she is not planning on being out but is having some life changes now and can't sleep and has to call out. Stated most 5-6 times monthly. Need in place to protect her job when this occurs. Works 8 hour shifts

## 2019-10-20 NOTE — Telephone Encounter (Signed)
Patient called and said that she has high anxiety and was unable to sleep. She had to call out of work today. She is worried about being out of work  And losing her job. They work on a point system. She would like to know about doing an fmla for intermittent time. She also would like something to help with the anxiety. Please give her a call at [phone]

## 2019-10-22 DIAGNOSIS — Z0289 Encounter for other administrative examinations: Secondary | ICD-10-CM

## 2019-10-22 MED FILL — ALPRAZolam 0.25 MG TABS: 0.25 | 30 days supply | Qty: 60 | Fill #1

## 2019-10-22 MED FILL — SUBVENITE 100 MG TABS: 100 | 90 days supply | Qty: 90 | Fill #0

## 2019-10-22 MED FILL — DIVALPROEX SOD ER 500 MG TA: 500 | 90 days supply | Qty: 180 | Fill #0

## 2019-11-25 ENCOUNTER — Telehealth: Payer: Self-pay | Admitting: Psychiatry

## 2019-11-25 DIAGNOSIS — F419 Anxiety disorder, unspecified: Secondary | ICD-10-CM

## 2019-11-25 DIAGNOSIS — F5101 Primary insomnia: Secondary | ICD-10-CM

## 2019-11-25 NOTE — Telephone Encounter (Signed)
Pt rx for Fanapt was increased to 2/qd. Pt is completely out and would like a refill sent in at [facility].

## 2019-11-26 MED ORDER — FANAPT 1 MG PO TABS: ORAL_TABLET | ORAL | 0 refills | Status: DC

## 2019-11-26 MED FILL — FANAPT 1 MG TABLET: 1 | 90 days supply | Qty: 180 | Fill #0

## 2019-12-02 ENCOUNTER — Other Ambulatory Visit: Payer: Self-pay | Admitting: Family Medicine

## 2019-12-02 DIAGNOSIS — R102 Pelvic and perineal pain: Secondary | ICD-10-CM

## 2019-12-08 ENCOUNTER — Ambulatory Visit (INDEPENDENT_AMBULATORY_CARE_PROVIDER_SITE_OTHER): Payer: No Typology Code available for payment source | Admitting: Psychiatry

## 2019-12-08 ENCOUNTER — Other Ambulatory Visit: Payer: Self-pay

## 2019-12-08 ENCOUNTER — Encounter: Payer: Self-pay | Admitting: Psychiatry

## 2019-12-08 ENCOUNTER — Ambulatory Visit
Admission: RE | Admit: 2019-12-08 | Discharge: 2019-12-08 | Disposition: A | Discharge status: Home / Self Care | Payer: No Typology Code available for payment source | Source: Ambulatory Visit | Attending: Family Medicine | Admitting: Family Medicine

## 2019-12-08 DIAGNOSIS — F5101 Primary insomnia: Secondary | ICD-10-CM

## 2019-12-08 DIAGNOSIS — F419 Anxiety disorder, unspecified: Secondary | ICD-10-CM

## 2019-12-08 DIAGNOSIS — R102 Pelvic and perineal pain: Secondary | ICD-10-CM

## 2019-12-08 MED ORDER — ALPRAZOLAM 0.25 MG PO TABS: ORAL_TABLET | ORAL | 1 refills | Status: DC

## 2019-12-08 MED ORDER — DAYVIGO 5 MG PO TABS: 5.0000 mg | ORAL_TABLET | Freq: Every day | ORAL | 0 refills | Status: DC

## 2019-12-08 MED FILL — ALPRAZolam 0.25 MG TABS: 0.25 | 30 days supply | Qty: 60 | Fill #0

## 2019-12-08 NOTE — Progress Notes (Signed)
Cindy Woods [id number] [birth year] 44 y.o.  Subjective:   [name] is a 44 y.o. (DOB 12-24-1975) female.  Chief Complaint:  Chief Complaint  Patient presents with  . Anxiety  . Insomnia    HPI [name] presents to the office today for follow-up of anxiety, mood s/s, and insomnia. She reports that she used FMLA Thursday and Friday due to severe insomnia. She reports that she is now having guilt and anxiety about taking FMLA before the weekend. She reports that she has obsessive thoughts about not being able to fall asleep. She has had some early morning awakenings. She reports that insomnia has been gradually worsening. Able to fall asleep ok over the weekend without anxiety of not being able to sleep since she did not have work. She reports that one night she took an extra Xanax to sleep and then felt as if she would pass out the next day at work. She reports that her anxiety has been elevated with some checking behaviors (alarm, door, straightening items). She reports that anxiety mainly occurs at night. Has had long-standing fears of losing her job and this had improved and has recently increased. She reports some worry about life stressors. She reports that she has started dating someone and this has caused some anxiety and doubts. Denies any full blown panic attacks. Reports one incident of chest tightness when around a relative.  She reports that she has had some low grade depression on Saturday and some since then. She reports that her mood is improved today. She reports increased irritability. Reports that irritability was "terrible" on Sunday. She reports that mood is euthymic other times. Appetite has been decreased. Energy has been lower. Motivation has been lower. Energy and motivation have been better today after adequate sleep last night. Concentration is ok. Denies anhedonia. Denies SI.   Has been taking Xanax qhs because she is afraid she will not be able to  sleep without it. She will sometimes eat before bed due to fear that she will get hungry and this will disrupt her sleep. Taking Fanapt 1.5 mg po QHS. She reports that she tried Fanapt 2 mg po QHS briefly.  She reports that she missed work on March 16th due to insomnia. She reports that she is not enjoying her job and standing on her feet the majority of the time. She is doing chemotherapy infusions. Works M-F. Returned to school recently for her BSN. Started dating someone 2 months ago.    Past Psychiatric Medication Trials: Seroquel Viibryd Wellbutrin Klonopin Latuda Abilify Fanapt Lithium Zoloft-Adverse effects Effexor Celexa Paxil BuSpar Lexapro Nuvigil Sonata- Was no longer effective and then was having to take more.Reports that she has had some residual grogginess the following day. Xanax Rexulti-restlessness Ativan Hydroxyzine Trintellix Temazepam Mirtazapine- Excessive daytime somnolence. Lamictal Depakote Trazodone   PHQ2-9     Office Visit from 01/13/2018 in [facility]  PHQ-2 Total Score  0  PHQ-9 Total Score  0       Review of Systems:  Review of Systems  Constitutional: Positive for appetite change.  Gastrointestinal: Positive for constipation.       Tightness across abdomen. Reports constipation improved and then has been worse recently.   Musculoskeletal: Positive for myalgias. Negative for gait problem.  Neurological: Negative for tremors.  Psychiatric/Behavioral:       Please refer to HPI    Medications: I have reviewed the patient's current medications.  Current Outpatient Medications  Medication Sig Dispense Refill  .  ALPRAZolam (XANAX) 0.25 MG tablet TAKE 1 - 2 TABLETS BY MOUTH EVERY NIGHT AT BEDTIME 60 tablet 1  . Iloperidone (FANAPT) 1 MG TABS Take 1-2 tabs po QHS 180 tablet 0  . lamoTRIgine (SUBVENITE) 100 MG tablet TAKE 1 TABLET (100 MG TOTAL) BY MOUTH DAILY. 90 tablet 1  . MAGNESIUM PO Take by mouth.    . cholecalciferol (VITAMIN  D3) 25 MCG (1000 UNIT) tablet Take 1,000 Units by mouth daily.    . divalproex (DEPAKOTE ER) 500 MG 24 hr tablet Take 2 tablets (1,000 mg total) by mouth daily. 180 tablet 1  . Lemborexant (DAYVIGO) 5 MG TABS Take 5 mg by mouth at bedtime. Can increase to 10 mg po QHS after 3-5 days if 5 mg is ineffective 10 tablet 0   No current facility-administered medications for this visit.    Medication Side Effects: None  Awakened with RLS one night.   Allergies: No Known Allergies  Past Medical History:  Diagnosis Date  . Bipolar II disorder ([place]) 06/15/2018    Family History  Problem Relation Age of Onset  . Depression Sister   . Anxiety disorder Brother     Social History   Socioeconomic History  . Marital status: Unknown    Spouse name: Not on file  . Number of children: Not on file  . Years of education: Not on file  . Highest education level: Not on file  Occupational History  . Not on file  Tobacco Use  . Smoking status: Never Smoker  . Smokeless tobacco: Never Used  Substance and Sexual Activity  . Alcohol use: Yes    Comment: Rare- "once a month"  . Drug use: Never  . Sexual activity: Not on file  Other Topics Concern  . Not on file  Social History Narrative  . Not on file   Social Determinants of Health   Financial Resource Strain:   . Difficulty of Paying Living Expenses:   Food Insecurity:   . Worried About Charity fundraiser in the Last Year:   . Arboriculturist in the Last Year:   Transportation Needs:   . Film/video editor (Medical):   Marland Kitchen Lack of Transportation (Non-Medical):   Physical Activity:   . Days of Exercise per Week:   . Minutes of Exercise per Session:   Stress:   . Feeling of Stress :   Social Connections:   . Frequency of Communication with Friends and Family:   . Frequency of Social Gatherings with Friends and Family:   . Attends Religious Services:   . Active Member of Clubs or Organizations:   . Attends Archivist  Meetings:   Marland Kitchen Marital Status:   Intimate Partner Violence:   . Fear of Current or Ex-Partner:   . Emotionally Abused:   Marland Kitchen Physically Abused:   . Sexually Abused:     Past Medical History, Surgical history, Social history, and Family history were reviewed and updated as appropriate.   Please see review of systems for further details on the patient's review from today.   Objective:   Physical Exam:  There were no vitals taken for this visit.  Physical Exam Constitutional:      General: She is not in acute distress. Musculoskeletal:        General: No deformity.  Neurological:     Mental Status: She is alert and oriented to person, place, and time.     Coordination: Coordination normal.  Psychiatric:  Attention and Perception: Attention and perception normal. She does not perceive auditory or visual hallucinations.        Mood and Affect: Mood is anxious. Mood is not depressed. Affect is not labile, blunt, angry or inappropriate.        Speech: Speech normal.        Behavior: Behavior normal.        Thought Content: Thought content normal. Thought content is not paranoid or delusional. Thought content does not include homicidal or suicidal ideation. Thought content does not include homicidal or suicidal plan.        Cognition and Memory: Cognition and memory normal.        Judgment: Judgment normal.     Comments: Insight intact     Lab Review:     Component Value Date/Time   NA 140 07/16/2018 1324   K 4.2 07/16/2018 1324   CL 102 07/16/2018 1324   CO2 24 07/16/2018 1324   GLUCOSE 82 07/16/2018 1324   BUN 15 07/16/2018 1324   CREATININE 0.88 07/16/2018 1324   CALCIUM 9.1 07/16/2018 1324   PROT 6.5 07/16/2018 1324   ALBUMIN 4.6 07/16/2018 1324   AST 11 07/16/2018 1324   ALT 9 07/16/2018 1324   ALKPHOS 37 (L) 07/16/2018 1324   BILITOT 0.3 07/16/2018 1324   GFRNONAA 82 07/16/2018 1324   GFRAA 95 07/16/2018 1324       Component Value Date/Time   WBC 5.7  07/16/2018 1324   RBC 4.10 07/16/2018 1324   HGB 13.1 07/16/2018 1324   HCT 38.8 07/16/2018 1324   PLT 186 07/16/2018 1324   MCV 95 07/16/2018 1324   MCH 32.0 07/16/2018 1324   MCHC 33.8 07/16/2018 1324   RDW 11.8 (L) 07/16/2018 1324   LYMPHSABS 2.0 07/16/2018 1324   EOSABS 0.2 07/16/2018 1324   BASOSABS 0.0 07/16/2018 1324    No results found for: POCLITH, LITHIUM   Lab Results  Component Value Date   VALPROATE 77 07/16/2018     .res Assessment: Plan:   Patient seen for 30 minutes and time spent discussing possible treatment options, to include increasing Fanapt to improve anxiety and insomnia since patient has experienced some improvement in the signs and symptoms since initiation of Fanapt.  Patient agrees to increase in [place]. Increase Fanapt to 2 mg po QHS.  Discussed potential benefits, risks, and and side effects of Dayvigo for insomnia.  Patient agrees to trial of Dayvigo.  Patient provided with Dayvigo 5 mg samples and advised to take 1 tablet by mouth at bedtime, and to increase to 2 tabs after 3 to 5 days if 5 mg dose is ineffective.  Patient advised to contact office if Dayvigo is effective and to request a script for the dose that is effective. Continue Xanax as needed for anxiety. Continue lamotrigine 100 mg daily for mood signs and symptoms. Continue Depakote ER 1000 mg at bedtime for mood signs and symptoms. Discussed that it may be helpful to resume psychotherapy since patient reports having some increased anxiety since she is started dating someone new. Patient to follow-up in 3 months or sooner if clinically indicated. Patient advised to contact office with any questions, adverse effects, or acute worsening in signs and symptoms.  Loys was seen today for anxiety and insomnia.  Diagnoses and all orders for this visit:  Primary insomnia -     Lemborexant (DAYVIGO) 5 MG TABS; Take 5 mg by mouth at bedtime. Can increase to 10 mg po  QHS after 3-5 days if 5 mg is  ineffective -     ALPRAZolam (XANAX) 0.25 MG tablet; TAKE 1 - 2 TABLETS BY MOUTH EVERY NIGHT AT BEDTIME  Anxiety disorder, unspecified type -     ALPRAZolam (XANAX) 0.25 MG tablet; TAKE 1 - 2 TABLETS BY MOUTH EVERY NIGHT AT BEDTIME     Please see After Visit Summary for patient specific instructions.  No future appointments.  No orders of the defined types were placed in this encounter.  Marland Kitchenv -------------------------------

## 2019-12-22 ENCOUNTER — Other Ambulatory Visit: Payer: Self-pay | Admitting: Psychiatry

## 2019-12-22 DIAGNOSIS — F5101 Primary insomnia: Secondary | ICD-10-CM

## 2019-12-22 MED ORDER — DAYVIGO 5 MG PO TABS: 5.0000 mg | ORAL_TABLET | Freq: Every day | ORAL | 2 refills | Status: DC

## 2019-12-22 MED FILL — DAYVIGO 5 MG TABS: 5 | 30 days supply | Qty: 30 | Fill #0

## 2019-12-22 NOTE — Telephone Encounter (Signed)
I asked her what MG. She said she didn't know. She had forgotten. So I was unable to get that info.

## 2019-12-22 NOTE — Telephone Encounter (Signed)
Pt called and stated she has finished her samples of DAYVIGO. She wants to know if she can be provided more samples or will an RX be sent in?

## 2019-12-24 ENCOUNTER — Ambulatory Visit
Admission: RE | Admit: 2019-12-24 | Discharge: 2019-12-24 | Disposition: A | Discharge status: Home / Self Care | Payer: No Typology Code available for payment source | Source: Ambulatory Visit | Attending: Obstetrics & Gynecology | Admitting: Obstetrics & Gynecology

## 2019-12-24 ENCOUNTER — Other Ambulatory Visit: Payer: Self-pay | Admitting: Obstetrics & Gynecology

## 2019-12-24 ENCOUNTER — Other Ambulatory Visit: Payer: Self-pay

## 2019-12-24 DIAGNOSIS — Z1231 Encounter for screening mammogram for malignant neoplasm of breast: Secondary | ICD-10-CM

## 2020-01-02 MED FILL — predniSONE 20 MG TABS: 20 | 12 days supply | Qty: 15 | Fill #0

## 2020-01-02 MED FILL — TRIAMCINOLONE ACETONIDE 0.1: 0.1 | 10 days supply | Qty: 80 | Fill #0

## 2020-01-21 MED FILL — DICYCLOMINE 10 MG CAPSULE: 10 | 10 days supply | Qty: 60 | Fill #0

## 2020-01-23 MED FILL — SUBVENITE 100 MG TABS: 100 | 90 days supply | Qty: 90 | Fill #1

## 2020-01-23 MED FILL — ALPRAZolam 0.25 MG TABS: 0.25 | 30 days supply | Qty: 60 | Fill #1

## 2020-01-23 MED FILL — DIVALPROEX SOD ER 500 MG TA: 500 | 90 days supply | Qty: 180 | Fill #1

## 2020-01-26 MED FILL — DAYVIGO 5 MG TABS: 5 | 30 days supply | Qty: 30 | Fill #1

## 2020-01-29 ENCOUNTER — Other Ambulatory Visit: Payer: Self-pay | Admitting: Gastroenterology

## 2020-01-29 ENCOUNTER — Ambulatory Visit
Admission: RE | Admit: 2020-01-29 | Discharge: 2020-01-29 | Disposition: A | Discharge status: Home / Self Care | Payer: No Typology Code available for payment source | Source: Ambulatory Visit | Attending: Gastroenterology | Admitting: Gastroenterology

## 2020-01-29 ENCOUNTER — Other Ambulatory Visit: Payer: Self-pay

## 2020-01-29 DIAGNOSIS — K5900 Constipation, unspecified: Secondary | ICD-10-CM

## 2020-02-16 MED FILL — PEG-3350 SOLUTION: 420 | 1 days supply | Qty: 4000 | Fill #0

## 2020-02-26 MED FILL — PEG-3350 SOLUTION: 420 | 1 days supply | Qty: 4000 | Fill #0

## 2020-03-04 ENCOUNTER — Other Ambulatory Visit: Payer: Self-pay | Admitting: Psychiatry

## 2020-03-04 DIAGNOSIS — F5101 Primary insomnia: Secondary | ICD-10-CM

## 2020-03-04 DIAGNOSIS — F419 Anxiety disorder, unspecified: Secondary | ICD-10-CM

## 2020-03-07 NOTE — Telephone Encounter (Signed)
Cindy Woods called to check on status of refill of her Xanax.  She made appt for 9/17.  Took her last dose today

## 2020-03-08 MED FILL — ALPRAZolam 0.25 MG TABS: 0.25 | 30 days supply | Qty: 60 | Fill #0

## 2020-04-05 ENCOUNTER — Telehealth: Payer: Self-pay | Admitting: Psychiatry

## 2020-04-05 DIAGNOSIS — F419 Anxiety disorder, unspecified: Secondary | ICD-10-CM

## 2020-04-05 DIAGNOSIS — F5101 Primary insomnia: Secondary | ICD-10-CM

## 2020-04-05 MED ORDER — FANAPT 2 MG PO TABS: 2.0000 mg | ORAL_TABLET | Freq: Every day | ORAL | 0 refills | Status: DC

## 2020-04-05 NOTE — Telephone Encounter (Signed)
Pt will be out of her rx for fanapt early because her rx was changed to take 2QD. Pt would like a refill sent in for 90 day supply of 2mg . Please send to [facility].

## 2020-04-05 NOTE — Telephone Encounter (Signed)
I'm not sure how she would run out, last Rx has #180 Fanapt 1 mg take 1-2.

## 2020-04-06 MED FILL — FANAPT 2 MG TABLET: 2 | 90 days supply | Qty: 90 | Fill #0

## 2020-04-16 ENCOUNTER — Other Ambulatory Visit: Payer: Self-pay | Admitting: Psychiatry

## 2020-04-16 DIAGNOSIS — F39 Unspecified mood [affective] disorder: Secondary | ICD-10-CM

## 2020-04-18 ENCOUNTER — Other Ambulatory Visit: Payer: Self-pay | Admitting: Psychiatry

## 2020-04-18 ENCOUNTER — Other Ambulatory Visit: Payer: Self-pay | Admitting: Family Medicine

## 2020-04-18 ENCOUNTER — Other Ambulatory Visit (HOSPITAL_COMMUNITY): Payer: Self-pay | Admitting: Family Medicine

## 2020-04-18 DIAGNOSIS — R1030 Lower abdominal pain, unspecified: Secondary | ICD-10-CM

## 2020-04-18 MED FILL — DIVALPROEX SOD ER 500 MG TA: 500 | 90 days supply | Qty: 180 | Fill #0

## 2020-04-18 MED FILL — SUBVENITE 100 MG TABS: 100 | 90 days supply | Qty: 90 | Fill #0

## 2020-04-18 NOTE — Telephone Encounter (Signed)
Review.

## 2020-04-18 NOTE — Telephone Encounter (Signed)
Pt called requesting refill for Lamotrigine (subvenite) 100 mg @ [facility] [address]21

## 2020-04-22 ENCOUNTER — Ambulatory Visit: Payer: No Typology Code available for payment source | Admitting: Psychiatry

## 2020-04-26 ENCOUNTER — Other Ambulatory Visit: Payer: Self-pay

## 2020-04-26 ENCOUNTER — Ambulatory Visit (INDEPENDENT_AMBULATORY_CARE_PROVIDER_SITE_OTHER): Payer: No Typology Code available for payment source | Admitting: Psychiatry

## 2020-04-26 ENCOUNTER — Encounter: Payer: Self-pay | Admitting: Psychiatry

## 2020-04-26 ENCOUNTER — Other Ambulatory Visit: Payer: Self-pay | Admitting: Psychiatry

## 2020-04-26 DIAGNOSIS — F39 Unspecified mood [affective] disorder: Secondary | ICD-10-CM | POA: Diagnosis not present

## 2020-04-26 DIAGNOSIS — F5101 Primary insomnia: Secondary | ICD-10-CM | POA: Diagnosis not present

## 2020-04-26 DIAGNOSIS — F419 Anxiety disorder, unspecified: Secondary | ICD-10-CM | POA: Diagnosis not present

## 2020-04-26 MED ORDER — FANAPT 2 MG PO TABS: 2.0000 mg | ORAL_TABLET | Freq: Every day | ORAL | 0 refills | Status: DC

## 2020-04-26 NOTE — Progress Notes (Signed)
Cindy Woods [id number] Dec 28, 1975 44 y.o.  Subjective:   [name] is a 44 y.o. (DOB 1976/07/10) female.  Chief Complaint:  Chief Complaint  Patient presents with   Follow-up    h/o Anxiety, insomnia, and mood disturbance    HPI Cindy Woods presents to the office today for follow-up of sleep disturbance, anxiety, and mood disturbance. She reports that her sleep is adequate with medication. She reports that she is needing Xanax less with taking Fanapt 2 mg QHS. She reports that she has some difficulty with getting fully awakened in the morning. Reports that it takes until about 10 am. She reports that she has slept through her alarm on occasion. She reports that her anxiety has been ok. She reports that she has some anxiety about not being able to sleep and this has improved in the last 2 weeks since she increased Fanapt to 2 mg QHS. She reports that her mood is consistent with baseline. She reports that she notices more depression more in the mornings, sometimes for 1-2 hours. She reports that she had brief periods of slightly elevated mood. She reports frequent irritability. Energy and motivation is ok. She reports that concentration is ok. She reports that she occasionally has difficulty retrieving familiar information, words, and names. Denies SI.   She recently cut sugar out of her diet and reports that this is not sustainable. 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   Taking 1/2 of Xanax 0.25 mg nightly.  Past Psychiatric Medication Trials: Seroquel Viibryd Wellbutrin Klonopin Latuda Abilify Fanapt Lithium Zoloft-Adverse effects Effexor Celexa Paxil BuSpar Lexapro Nuvigil Sonata- Was no longer effective and then was having to take more.Reports that she has had some residual grogginess the following day. Xanax Rexulti-restlessness Ativan Hydroxyzine Trintellix Temazepam Mirtazapine- Excessive daytime  somnolence. Lamictal Depakote Trazodone Dayvigo- Minimally effective and excessive grogginess  AIMS     Office Visit from 04/26/2020 in [place] Total Score 0    PHQ2-9     Office Visit from 01/13/2018 in [place]  PHQ-2 Total Score 0  PHQ-9 Total Score 0       Review of Systems:  Review of Systems  Gastrointestinal: Positive for abdominal distention, abdominal pain and constipation.  Musculoskeletal: Negative for gait problem.  Neurological: Negative for tremors.  Psychiatric/Behavioral:       Please refer to HPI    Medications: I have reviewed the patient's current medications.  Current Outpatient Medications  Medication Sig Dispense Refill   ALPRAZolam (XANAX) 0.25 MG tablet TAKE 1 - 2 TABLETS BY MOUTH EVERY NIGHT AT BEDTIME 60 tablet 1   divalproex (DEPAKOTE ER) 500 MG 24 hr tablet TAKE 2 TABLETS BY MOUTH DAILY 180 tablet 1   Iloperidone (FANAPT) 2 MG TABS Take 1 tablet (2 mg total) by mouth at bedtime. 90 tablet 0   SUBVENITE 100 MG tablet TAKE 1 TABLET BY MOUTH ONCE DAILY 90 tablet 1   cholecalciferol (VITAMIN D3) 25 MCG (1000 UNIT) tablet Take 1,000 Units by mouth daily. Takes occ     No current facility-administered medications for this visit.    Medication Side Effects: None  Allergies: No Known Allergies  Past Medical History:  Diagnosis Date   Bipolar II disorder ([place]) 06/15/2018    Family History  Problem Relation Age of Onset   Depression Sister    Anxiety disorder Brother    Breast cancer Maternal Grandmother     Social History  Socioeconomic History   Marital status: Unknown    Spouse name: Not on file   Number of children: Not on file   Years of education: Not on file   Highest education level: Not on file  Occupational History   Not on file  Tobacco Use   Smoking status: Never Smoker   Smokeless tobacco: Never Used  Vaping Use   Vaping Use: Never used  Substance and Sexual Activity    Alcohol use: Yes    Comment: Rare- "once a month"   Drug use: Never   Sexual activity: Not on file  Other Topics Concern   Not on file  Social History Narrative   Not on file   Social Determinants of Health   Financial Resource Strain:    Difficulty of Paying Living Expenses: Not on file  Food Insecurity:    Worried About [place] in the Last Year: Not on file   Ran Out of Food in the Last Year: Not on file  Transportation Needs:    Lack of Transportation (Medical): Not on file   Lack of Transportation (Non-Medical): Not on file  Physical Activity:    Days of Exercise per Week: Not on file   Minutes of Exercise per Session: Not on file  Stress:    Feeling of Stress : Not on file  Social Connections:    Frequency of Communication with Friends and Family: Not on file   Frequency of Social Gatherings with Friends and Family: Not on file   Attends Religious Services: Not on file   Active Member of Clubs or Organizations: Not on file   Attends Archivist Meetings: Not on file   Marital Status: Not on file  Intimate Partner Violence:    Fear of Current or Ex-Partner: Not on file   Emotionally Abused: Not on file   Physically Abused: Not on file   Sexually Abused: Not on file    Past Medical History, Surgical history, Social history, and Family history were reviewed and updated as appropriate.   Please see review of systems for further details on the patient's review from today.   Objective:   Physical Exam:  There were no vitals taken for this visit.  Physical Exam Constitutional:      General: She is not in acute distress. Musculoskeletal:        General: No deformity.  Neurological:     Mental Status: She is alert and oriented to person, place, and time.     Coordination: Coordination normal.  Psychiatric:        Attention and Perception: Attention and perception normal. She does not perceive auditory or visual  hallucinations.        Mood and Affect: Mood normal. Mood is not anxious or depressed. Affect is not labile, blunt, angry or inappropriate.        Speech: Speech normal.        Behavior: Behavior normal.        Thought Content: Thought content normal. Thought content is not paranoid or delusional. Thought content does not include homicidal or suicidal ideation. Thought content does not include homicidal or suicidal plan.        Cognition and Memory: Cognition and memory normal.        Judgment: Judgment normal.     Comments: Insight intact Patient reports some irritability     Lab Review:     Component Value Date/Time   NA 140 07/16/2018 1324  K 4.2 07/16/2018 1324   CL 102 07/16/2018 1324   CO2 24 07/16/2018 1324   GLUCOSE 82 07/16/2018 1324   BUN 15 07/16/2018 1324   CREATININE 0.88 07/16/2018 1324   CALCIUM 9.1 07/16/2018 1324   PROT 6.5 07/16/2018 1324   ALBUMIN 4.6 07/16/2018 1324   AST 11 07/16/2018 1324   ALT 9 07/16/2018 1324   ALKPHOS 37 (L) 07/16/2018 1324   BILITOT 0.3 07/16/2018 1324   GFRNONAA 82 07/16/2018 1324   GFRAA 95 07/16/2018 1324       Component Value Date/Time   WBC 5.7 07/16/2018 1324   RBC 4.10 07/16/2018 1324   HGB 13.1 07/16/2018 1324   HCT 38.8 07/16/2018 1324   PLT 186 07/16/2018 1324   MCV 95 07/16/2018 1324   MCH 32.0 07/16/2018 1324   MCHC 33.8 07/16/2018 1324   RDW 11.8 (L) 07/16/2018 1324   LYMPHSABS 2.0 07/16/2018 1324   EOSABS 0.2 07/16/2018 1324   BASOSABS 0.0 07/16/2018 1324    No results found for: POCLITH, LITHIUM   Lab Results  Component Value Date   VALPROATE 77 07/16/2018     .res Assessment: Plan:   Pt. reports labs were WNL over the summer during annual physical exam. Will obtain information release for PCP and request copy of lab results. Patient reports that she would like to continue current medications without changes. Continue Fanapt 2 mg at bedtime for anxiety and insomnia. Continue Depakote ER 1000 mg at  bedtime for mood signs and symptoms. Continue Subvenite 100 mg daily for mood signs and symptoms. Patient to follow-up in 4 months or sooner if clinically indicated. Patient advised to contact office with any questions, adverse effects, or acute worsening in signs and symptoms.   Cindy Woods was seen today for follow-up.  Diagnoses and all orders for this visit:  Anxiety disorder, unspecified type -     Iloperidone (FANAPT) 2 MG TABS; Take 1 tablet (2 mg total) by mouth at bedtime.  Primary insomnia -     Iloperidone (FANAPT) 2 MG TABS; Take 1 tablet (2 mg total) by mouth at bedtime.  Episodic mood disorder ([place])     Please see After Visit Summary for patient specific instructions.  Future Appointments  Date Time Provider [place]  05/05/2020  1:00 PM [facility] 1 [facility]   08/25/2020 10:30 AM [name], PMHNP [facility]-CP None    No orders of the defined types were placed in this encounter.   -------------------------------

## 2020-04-26 NOTE — Progress Notes (Signed)
   04/26/20 1026  Facial and Oral Movements  Muscles of Facial Expression 0  Lips and Perioral Area 0  Jaw 0  Tongue 0  Extremity Movements  Upper (arms, wrists, hands, fingers) 0  Lower (legs, knees, ankles, toes) 0  Trunk Movements  Neck, shoulders, hips 0  Overall Severity  Severity of abnormal movements (highest score from questions above) 0  Incapacitation due to abnormal movements 0  Patient's awareness of abnormal movements (rate only patient's report) 0  AIMS Total Score  AIMS Total Score 0

## 2020-04-28 MED FILL — ALPRAZolam 0.25 MG TABS: 0.25 | 30 days supply | Qty: 60 | Fill #1

## 2020-05-05 ENCOUNTER — Ambulatory Visit (HOSPITAL_COMMUNITY)
Admission: RE | Admit: 2020-05-05 | Discharge: 2020-05-05 | Disposition: A | Discharge status: Home / Self Care | Payer: No Typology Code available for payment source | Source: Ambulatory Visit | Attending: Family Medicine | Admitting: Family Medicine

## 2020-05-05 ENCOUNTER — Other Ambulatory Visit: Payer: Self-pay

## 2020-05-05 DIAGNOSIS — R1030 Lower abdominal pain, unspecified: Secondary | ICD-10-CM | POA: Insufficient documentation

## 2020-05-05 MED ORDER — IOHEXOL 300 MG/ML  SOLN
100.0000 mL | Freq: Once | INTRAMUSCULAR | Status: AC | PRN
  Administered 2020-05-05: 100 mL via INTRAVENOUS

## 2020-07-12 MED FILL — DIVALPROEX SOD ER 500 MG TA: 500 | 90 days supply | Qty: 180 | Fill #1

## 2020-07-16 MED FILL — SUBVENITE 100 MG TABS: 100 | 90 days supply | Qty: 90 | Fill #1

## 2020-07-16 MED FILL — FANAPT 2 MG TABLET: 2 | 90 days supply | Qty: 90 | Fill #0

## 2020-07-25 ENCOUNTER — Other Ambulatory Visit: Payer: Self-pay | Admitting: Psychiatry

## 2020-07-25 DIAGNOSIS — F419 Anxiety disorder, unspecified: Secondary | ICD-10-CM

## 2020-07-25 DIAGNOSIS — F5101 Primary insomnia: Secondary | ICD-10-CM

## 2020-07-25 MED FILL — ALPRAZolam 0.25 MG TABS: 0.25 | 30 days supply | Qty: 60 | Fill #0

## 2020-08-25 ENCOUNTER — Ambulatory Visit: Payer: No Typology Code available for payment source | Admitting: Psychiatry

## 2020-10-14 ENCOUNTER — Telehealth: Payer: Self-pay | Admitting: Psychiatry

## 2020-10-14 ENCOUNTER — Other Ambulatory Visit: Payer: Self-pay | Admitting: Psychiatry

## 2020-10-14 DIAGNOSIS — F39 Unspecified mood [affective] disorder: Secondary | ICD-10-CM

## 2020-10-14 DIAGNOSIS — F5101 Primary insomnia: Secondary | ICD-10-CM

## 2020-10-14 DIAGNOSIS — F419 Anxiety disorder, unspecified: Secondary | ICD-10-CM

## 2020-10-14 MED FILL — ALPRAZolam 0.25 MG TABS: 0.25 | 30 days supply | Qty: 60 | Fill #1

## 2020-10-14 MED FILL — FANAPT 2 MG TABLET: 2 | 90 days supply | Qty: 90 | Fill #0

## 2020-10-14 MED FILL — SUBVENITE 100 MG TABS: 100 | 90 days supply | Qty: 90 | Fill #0

## 2020-10-14 MED FILL — DIVALPROEX SOD ER 500 MG TA: 500 | 90 days supply | Qty: 180 | Fill #0

## 2020-10-14 NOTE — Telephone Encounter (Signed)
Left message for pt to call and schedule an appointment.

## 2020-10-17 NOTE — Telephone Encounter (Signed)
Error

## 2020-11-25 ENCOUNTER — Ambulatory Visit: Payer: No Typology Code available for payment source | Admitting: Psychiatry

## 2020-12-01 ENCOUNTER — Other Ambulatory Visit (HOSPITAL_COMMUNITY): Payer: Self-pay

## 2020-12-01 ENCOUNTER — Telehealth (INDEPENDENT_AMBULATORY_CARE_PROVIDER_SITE_OTHER): Payer: No Typology Code available for payment source | Admitting: Psychiatry

## 2020-12-01 ENCOUNTER — Encounter: Payer: Self-pay | Admitting: Psychiatry

## 2020-12-01 DIAGNOSIS — F5101 Primary insomnia: Secondary | ICD-10-CM | POA: Diagnosis not present

## 2020-12-01 DIAGNOSIS — F419 Anxiety disorder, unspecified: Secondary | ICD-10-CM | POA: Diagnosis not present

## 2020-12-01 DIAGNOSIS — Z79899 Other long term (current) drug therapy: Secondary | ICD-10-CM | POA: Diagnosis not present

## 2020-12-01 DIAGNOSIS — F39 Unspecified mood [affective] disorder: Secondary | ICD-10-CM | POA: Diagnosis not present

## 2020-12-01 MED ORDER — SUBVENITE 100 MG PO TABS
100.0000 mg | ORAL_TABLET | Freq: Every day | ORAL | 1 refills | Status: DC
  Filled 2020-12-01 – 2021-01-13 (×2): qty 90, 90d supply, fill #0

## 2020-12-01 MED ORDER — ALPRAZOLAM 0.25 MG PO TABS
ORAL_TABLET | Freq: Every day | ORAL | 2 refills | Status: DC
  Filled 2020-12-01 – 2021-01-13 (×2): qty 60, 30d supply, fill #0
  Filled 2021-04-14: qty 60, 30d supply, fill #1

## 2020-12-01 MED ORDER — ILOPERIDONE 2 MG PO TABS
1.0000 | ORAL_TABLET | Freq: Every day | ORAL | 1 refills | Status: DC
  Filled 2020-12-01 – 2021-01-13 (×2): qty 90, 90d supply, fill #0

## 2020-12-01 NOTE — Progress Notes (Signed)
Cindy Woods [id number] [birth year] 45 y.o.  Virtual Visit via Telephone Note  I connected with pt on 12/01/20 at  2:45 PM EDT by telephone and verified that I am speaking with the correct person using two identifiers.   I discussed the limitations, risks, security and privacy concerns of performing an evaluation and management service by telephone and the availability of in person appointments. I also discussed with the patient that there may be a patient responsible charge related to this service. The patient expressed understanding and agreed to proceed.   I discussed the assessment and treatment plan with the patient. The patient was provided an opportunity to ask questions and all were answered. The patient agreed with the plan and demonstrated an understanding of the instructions.   The patient was advised to call back or seek an in-person evaluation if the symptoms worsen or if the condition fails to improve as anticipated.  I provided 30 minutes of non-face-to-face time during this encounter.  The patient was located at home.  The provider was located at [place].   [name], PMHNP   Subjective:   [name] is a 45 y.o. (DOB [birth year]) female.  Chief Complaint:  Chief Complaint  Patient presents with  . Follow-up    Anxiety, mood disturbance, insomnia    HPI Cindy Woods presents for follow-up of mood disturbance and anxiety. She reports that she has been "fine... no changes." Denies depressed mood. Occ brief elevated moods. No change in irritability. She reports that work has been stressful. She reports that anxiety has been manageable. Denies panic attacks. She reports that her sleep is adequate with medication. Appetite has been ok. Concentration has been fine. Denies SI.   Typically taking Xanax 0.25 mg 1/2 tablet at bedtime.  Past Psychiatric Medication  Trials: Seroquel Viibryd Wellbutrin Klonopin Latuda Abilify Fanapt Lithium Zoloft-Adverse effects Effexor Celexa Paxil BuSpar Lexapro Nuvigil Sonata- Was no longer effective and then was having to take more.Reports that she has had some residual grogginess the following day. Xanax Rexulti-restlessness Ativan Hydroxyzine Trintellix Temazepam Mirtazapine- Excessive daytime somnolence. Lamictal- anxious side effect. Prefers Subvenite Subvenite Depakote Trazodone Dayvigo- Minimally effective and excessive grogginess  Review of Systems:  Review of Systems  Musculoskeletal: Negative for gait problem.  Neurological: Negative for tremors.  Psychiatric/Behavioral:       Please refer to HPI    Medications: I have reviewed the patient's current medications.  Current Outpatient Medications  Medication Sig Dispense Refill  . divalproex (DEPAKOTE ER) 500 MG 24 hr tablet TAKE 2 TABLETS BY MOUTH ONCE A DAY 180 tablet 1  . Melatonin 5 MG CAPS Take by mouth.    . ALPRAZolam (XANAX) 0.25 MG tablet TAKE 1 TO 2 TABLETS BY MOUTH AT BEDTIME 60 tablet 2  . Iloperidone 2 MG TABS TAKE 1 TABLET BY MOUTH AT BEDTIME 90 tablet 1  . SUBVENITE 100 MG tablet TAKE 1 TABLET BY MOUTH ONCE DAILY 90 tablet 1   No current facility-administered medications for this visit.    Medication Side Effects: Other: Difficulty getting out of bed in the morning. Denies TD.   Allergies: No Known Allergies  Past Medical History:  Diagnosis Date  . Bipolar II disorder ([place]) 06/15/2018    Family History  Problem Relation Age of Onset  . Depression Sister   . Anxiety disorder Brother   . Breast cancer Maternal Grandmother     Social History   Socioeconomic History  . Marital status:  Unknown    Spouse name: Not on file  . Number of children: Not on file  . Years of education: Not on file  . Highest education level: Not on file  Occupational History  . Not on file  Tobacco Use  . Smoking  status: Never Smoker  . Smokeless tobacco: Never Used  Vaping Use  . Vaping Use: Never used  Substance and Sexual Activity  . Alcohol use: Yes    Comment: Rare- "once a month"  . Drug use: Never  . Sexual activity: Not on file  Other Topics Concern  . Not on file  Social History Narrative  . Not on file   Social Determinants of Health   Financial Resource Strain: Not on file  Food Insecurity: Not on file  Transportation Needs: Not on file  Physical Activity: Not on file  Stress: Not on file  Social Connections: Not on file  Intimate Partner Violence: Not on file    Past Medical History, Surgical history, Social history, and Family history were reviewed and updated as appropriate.   Please see review of systems for further details on the patient's review from today.   Objective:   Physical Exam:  There were no vitals taken for this visit.  Physical Exam Neurological:     Mental Status: She is alert and oriented to person, place, and time.     Cranial Nerves: No dysarthria.  Psychiatric:        Attention and Perception: Attention and perception normal.        Mood and Affect: Mood normal.        Speech: Speech normal.        Behavior: Behavior is cooperative.        Thought Content: Thought content normal. Thought content is not paranoid or delusional. Thought content does not include homicidal or suicidal ideation. Thought content does not include homicidal or suicidal plan.        Cognition and Memory: Cognition and memory normal.        Judgment: Judgment normal.     Comments: Insight intact     Lab Review:     Component Value Date/Time   NA 140 07/16/2018 1324   K 4.2 07/16/2018 1324   CL 102 07/16/2018 1324   CO2 24 07/16/2018 1324   GLUCOSE 82 07/16/2018 1324   BUN 15 07/16/2018 1324   CREATININE 0.88 07/16/2018 1324   CALCIUM 9.1 07/16/2018 1324   PROT 6.5 07/16/2018 1324   ALBUMIN 4.6 07/16/2018 1324   AST 11 07/16/2018 1324   ALT 9 07/16/2018  1324   ALKPHOS 37 (L) 07/16/2018 1324   BILITOT 0.3 07/16/2018 1324   GFRNONAA 82 07/16/2018 1324   GFRAA 95 07/16/2018 1324       Component Value Date/Time   WBC 5.7 07/16/2018 1324   RBC 4.10 07/16/2018 1324   HGB 13.1 07/16/2018 1324   HCT 38.8 07/16/2018 1324   PLT 186 07/16/2018 1324   MCV 95 07/16/2018 1324   MCH 32.0 07/16/2018 1324   MCHC 33.8 07/16/2018 1324   RDW 11.8 (L) 07/16/2018 1324   LYMPHSABS 2.0 07/16/2018 1324   EOSABS 0.2 07/16/2018 1324   BASOSABS 0.0 07/16/2018 1324    No results found for: POCLITH, LITHIUM   Lab Results  Component Value Date   VALPROATE 77 07/16/2018     .res Assessment: Plan:    Will re-order labs to evaluate for potential adverse effects and encouraged pt to go for  labs. Will contact pt with lab results. Will continue current plan of care since target signs and symptoms are well controlled without any tolerability issues. Continue Depakote ER 1000 mg po QHS for mood stabilization. Continue Subvenite 100 mg po qd for mood s/s. Continue Alprazolam 0.25 mg 1-2 tabs at bedtime for anxiety and insomnia.  Continue Fanapt for mood s/s. Fanapt has also been helpful for her insomnia and anxiety. Patient follow-up in 6 months or sooner if clinically indicated. Patient advised to contact office with any questions, adverse effects, or acute worsening in signs and symptoms.  Cindy Woods was seen today for follow-up.  Diagnoses and all orders for this visit:  Primary insomnia -     ALPRAZolam (XANAX) 0.25 MG tablet; TAKE 1 TO 2 TABLETS BY MOUTH AT BEDTIME -     Iloperidone 2 MG TABS; TAKE 1 TABLET BY MOUTH AT BEDTIME  High risk medication use -     Comprehensive metabolic panel -     CBC -     Valproic acid level  Anxiety disorder, unspecified type -     ALPRAZolam (XANAX) 0.25 MG tablet; TAKE 1 TO 2 TABLETS BY MOUTH AT BEDTIME -     Iloperidone 2 MG TABS; TAKE 1 TABLET BY MOUTH AT BEDTIME  Episodic mood disorder ([facility]) -     SUBVENITE  100 MG tablet; TAKE 1 TABLET BY MOUTH ONCE DAILY    Please see After Visit Summary for patient specific instructions.  No future appointments.  Orders Placed This Encounter  Procedures  . Comprehensive metabolic panel  . CBC  . Valproic acid level      -------------------------------

## 2020-12-12 ENCOUNTER — Other Ambulatory Visit (HOSPITAL_COMMUNITY): Payer: Self-pay

## 2021-01-13 ENCOUNTER — Other Ambulatory Visit (HOSPITAL_COMMUNITY): Payer: Self-pay

## 2021-01-13 MED FILL — Divalproex Sodium Tab ER 24 HR 500 MG: ORAL | 90 days supply | Qty: 180 | Fill #0 | Status: AC

## 2021-01-14 ENCOUNTER — Other Ambulatory Visit (HOSPITAL_COMMUNITY): Payer: Self-pay

## 2021-01-16 ENCOUNTER — Other Ambulatory Visit (HOSPITAL_COMMUNITY): Payer: Self-pay

## 2021-01-23 ENCOUNTER — Telehealth: Payer: Self-pay | Admitting: Psychiatry

## 2021-01-23 DIAGNOSIS — F5101 Primary insomnia: Secondary | ICD-10-CM

## 2021-01-23 DIAGNOSIS — F419 Anxiety disorder, unspecified: Secondary | ICD-10-CM

## 2021-01-23 NOTE — Telephone Encounter (Signed)
Pt wants to decrease xanax or stop it all together.She stated she feels like the Xanax is ruining her life.She can't function at work and others are noticing.It makes her dizzy and she has a hard time waking up and staying awake.She stated she feels "like crap"She wants to know if there is an alternative.She also stated she needs FMLA paperwork for intermittent leave again.

## 2021-01-23 NOTE — Telephone Encounter (Signed)
Pt called and said that she is having extreme headaches and she knows it from the medicine. She would like to get off this medicine. Please give her a call at [phone]

## 2021-01-24 ENCOUNTER — Other Ambulatory Visit (HOSPITAL_COMMUNITY): Payer: Self-pay

## 2021-01-24 MED ORDER — GABAPENTIN 100 MG PO CAPS
100.0000 mg | ORAL_CAPSULE | Freq: Every evening | ORAL | 0 refills | Status: DC | PRN
  Filled 2021-01-24: qty 30, 30d supply, fill #0

## 2021-01-24 NOTE — Telephone Encounter (Addendum)
Returned call to pt. She reports that she recently has been getting severe headaches. She reports that if she takes more than 1/2 of Xanax 0.25 mg "the results are terrible." She reports that she is having transient dizziness. She reports that she did not take Xanax over the weekend and "felt great" other than some mild withdrawal s/s. She reports that some nights if she feels "hyper" 1/2 tab of Xanax will not be effective and then takes more and feels worse the following day. She reports that she also notices a "psychological" dependence on Xanax and fear that she will not sleep without it. She reports that Sonata and Temazepam had similar effects.   She reports difficulty getting out of bed with Fanapt.   She reports that she is considering re-starting therapy.   She is requesting intermittent FMLA. Would like to have 4 occurrences per month with absences.  She reports that she will find out the best process at her work and may request that her employer send documentation to this office.  Discussed potential benefits, risks, and side effects of gabapentin.  Discussed that gabapentin is used off label for anxiety and also causes some mild drowsiness, and therefore may be an alternative to benzodiazepine to treat her anxiety surrounding insomnia and also cause some drowsiness to help with sleep initiation.  Patient agrees to trial of gabapentin.  She reports that she will also attempt to reduce Xanax as tolerated.  Patient has follow-up appointment scheduled for next month. Patient advised to contact office with any questions, adverse effects, or acute worsening in signs and symptoms.

## 2021-01-24 NOTE — Addendum Note (Signed)
Addended by: Sharyl Nimrod on: 01/24/2021 05:22 PM   Modules accepted: Orders

## 2021-01-26 ENCOUNTER — Telehealth: Payer: Self-pay | Admitting: Psychiatry

## 2021-01-26 ENCOUNTER — Ambulatory Visit (INDEPENDENT_AMBULATORY_CARE_PROVIDER_SITE_OTHER): Payer: No Typology Code available for payment source | Admitting: Obstetrics & Gynecology

## 2021-01-26 ENCOUNTER — Encounter: Payer: Self-pay | Admitting: Obstetrics & Gynecology

## 2021-01-26 ENCOUNTER — Other Ambulatory Visit (HOSPITAL_COMMUNITY)
Admission: RE | Admit: 2021-01-26 | Discharge: 2021-01-26 | Disposition: A | Discharge status: Home / Self Care | Payer: No Typology Code available for payment source | Source: Ambulatory Visit | Attending: Obstetrics & Gynecology | Admitting: Obstetrics & Gynecology

## 2021-01-26 ENCOUNTER — Other Ambulatory Visit: Payer: Self-pay

## 2021-01-26 VITALS — BP 106/67 | HR 63 | Ht 68.0 in | Wt 146.2 lb

## 2021-01-26 DIAGNOSIS — Z1231 Encounter for screening mammogram for malignant neoplasm of breast: Secondary | ICD-10-CM | POA: Diagnosis not present

## 2021-01-26 DIAGNOSIS — Z803 Family history of malignant neoplasm of breast: Secondary | ICD-10-CM | POA: Diagnosis not present

## 2021-01-26 DIAGNOSIS — Z01419 Encounter for gynecological examination (general) (routine) without abnormal findings: Secondary | ICD-10-CM | POA: Diagnosis present

## 2021-01-26 NOTE — Telephone Encounter (Signed)
FMLA form received placed on Traci's desk 06/23.

## 2021-01-26 NOTE — Progress Notes (Signed)
   WELL-WOMAN EXAMINATION Patient name: Cindy Woods MRN [id number]  Date of birth: 01-20-1976 Chief Complaint:   Gynecologic Exam  History of Present Illness:   Cindy Woods is a 45 y.o. G0P0000 female being seen today for a routine well-woman exam.  Former Animal nutritionist patient, transferring. Today she notes no acute complaints or concerns  Denies issues with her menses, usually every month The current method of family planning is abstinence.   Working as oncology nurse  Last pap 2019.  Last mammogram: 12/2019. Last colonoscopy: per pt had one recently- polyps removed, has to f/u in 61yrs  Depression screen [facility] 2/9 01/26/2021 01/13/2018  Decreased Interest 0 0  Down, Depressed, Hopeless 1 0  PHQ - 2 Score 1 0  Altered sleeping 2 0  Tired, decreased energy 2 0  Change in appetite 1 0  Feeling bad or failure about yourself  1 0  Trouble concentrating 1 0  Moving slowly or fidgety/restless 0 0  Suicidal thoughts 0 0  PHQ-9 Score 8 0     Review of Systems:   Pertinent items are noted in HPI Denies any headaches, blurred vision, fatigue, shortness of breath, chest pain, abdominal pain, bowel movements, urination, or intercourse unless otherwise stated above.  Pertinent History Reviewed:  Reviewed past medical,surgical, social and family history.  Reviewed problem list, medications and allergies. Physical Assessment:   Vitals:   01/26/21 1450  BP: 106/67  Pulse: 63  Weight: 146 lb 3.2 oz (66.3 kg)  Height: 5\' 8"  (1.727 m)  Body mass index is 22.23 kg/m.        Physical Examination:   General appearance - well appearing, and in no distress  Mental status - alert, oriented to person, place, and time  Psych:  She has a normal mood and affect  Skin - warm and dry, normal color, no suspicious lesions noted  Chest - effort normal, all lung fields clear to auscultation bilaterally  Heart - normal rate and regular rhythm  Neck:  midline trachea, no thyromegaly  or nodules  Breasts - breasts appear normal, no suspicious masses, no skin or nipple changes or  axillary nodes  Abdomen - soft, nontender, nondistended, no masses or organomegaly  Pelvic - VULVA: normal appearing vulva with no masses, tenderness or lesions  VAGINA: normal appearing vagina with normal color and discharge, no lesions  CERVIX: normal appearing cervix without discharge or lesions, no CMT  Thin prep pap is done with HR HPV cotesting  UTERUS: uterus is felt to be normal size, shape, consistency and nontender   ADNEXA: No adnexal masses or tenderness noted.  Extremities:  No swelling or varicosities noted  Chaperone: Celene Squibb     Assessment & Plan:  1) Well-Woman Exam -pap collected, reviewed screening guidelines -mammogram ordered -STI screening to also be completed  2) Family h/o breast cancer -cousin recently went through breast Ca and maternal GM -interested in ways to prevent -reviewed hereditary cancer screening- pt to do some research on her own  Orders Placed This Encounter  Procedures   MM 3D SCREEN BREAST BILATERAL   HIV Antibody (routine testing w rflx)   RPR    Meds: No orders of the defined types were placed in this encounter.   Follow-up: Return in about 1 year (around 01/26/2022) for Annual, with Dr. Nelda Marseille.   Janyth Pupa, DO Attending [place], [facility] for [employer], [place]

## 2021-01-30 ENCOUNTER — Telehealth: Payer: Self-pay | Admitting: Psychiatry

## 2021-01-30 NOTE — Telephone Encounter (Signed)
Received fax from [employer] regarding Cindy Woods. Need completion of FMLA form. Placed in Toni's box on door.

## 2021-01-31 LAB — CYTOLOGY - PAP
Chlamydia: NEGATIVE
Comment: NEGATIVE
Comment: NEGATIVE
Comment: NORMAL
High risk HPV: NEGATIVE
Neisseria Gonorrhea: NEGATIVE

## 2021-01-31 NOTE — Telephone Encounter (Signed)
Form completed per Vivien Rota

## 2021-02-01 ENCOUNTER — Telehealth: Payer: Self-pay | Admitting: Obstetrics & Gynecology

## 2021-02-01 ENCOUNTER — Telehealth: Payer: Self-pay | Admitting: *Deleted

## 2021-02-01 DIAGNOSIS — Z0289 Encounter for other administrative examinations: Secondary | ICD-10-CM

## 2021-02-01 NOTE — Telephone Encounter (Signed)
Left message @ 11:52 am concerning pap results. Cindy Woods

## 2021-02-01 NOTE — Telephone Encounter (Signed)
Patient is coming in for a colpo on 7/11 and she wouldlike to know if she needs to do an FMLA for a day. Please contact pt

## 2021-02-01 NOTE — Telephone Encounter (Signed)
Pt aware of her abnormal pap and needs to schedule a colpo. Pt voiced understanding. Call transferred to [facility] for appt. [place]

## 2021-02-01 NOTE — Telephone Encounter (Signed)
Pt aware no FMLA needed for a colpo. Pt wanted to change her appt so call was transferred back to [facility]. [place]

## 2021-02-02 ENCOUNTER — Encounter: Payer: Self-pay | Admitting: Obstetrics & Gynecology

## 2021-02-07 ENCOUNTER — Telehealth: Payer: Self-pay | Admitting: Psychiatry

## 2021-02-07 NOTE — Telephone Encounter (Signed)
This form has been completed and placed in admin box last week,was anyone able to fax it?

## 2021-02-07 NOTE — Telephone Encounter (Signed)
Received fax from [employer] regarding Cindy Woods for completion of FMLA form. Placed on Traci's desk

## 2021-02-08 NOTE — Telephone Encounter (Signed)
Yes, faxed 02/03/21 to [employer] @ [phone]. Do I need to fax again?

## 2021-02-08 NOTE — Telephone Encounter (Signed)
Placed in folder to scan. If not in there should be in system. Or check with Baxter Flattery.

## 2021-02-08 NOTE — Telephone Encounter (Signed)
Probably not,they may have sent a duplicate.Is the form uploaded just in case they do call and need [place] to fax it again?

## 2021-02-09 ENCOUNTER — Other Ambulatory Visit (HOSPITAL_COMMUNITY): Payer: Self-pay

## 2021-02-09 ENCOUNTER — Telehealth: Payer: Self-pay | Admitting: Psychiatry

## 2021-02-09 NOTE — Telephone Encounter (Signed)
Pt called stating she may change jobs and ins will be [place]. Concerned about Depakote and Sanapt. Would be much more expensive and asking if you know of any way she could possible get assistance with one or both meds? Need to decide in couple of days about job. I told her assistance with paperwork and send to manufacturer take more than couple days and you may know something else. [phone]

## 2021-02-10 NOTE — Telephone Encounter (Signed)
Called Pt back and LM on VM with information from [place].  Call office with any other questions.

## 2021-02-13 ENCOUNTER — Encounter: Payer: No Typology Code available for payment source | Admitting: Obstetrics & Gynecology

## 2021-02-14 ENCOUNTER — Other Ambulatory Visit: Payer: Self-pay

## 2021-02-14 ENCOUNTER — Encounter: Payer: Self-pay | Admitting: Obstetrics & Gynecology

## 2021-02-14 ENCOUNTER — Ambulatory Visit (INDEPENDENT_AMBULATORY_CARE_PROVIDER_SITE_OTHER): Payer: No Typology Code available for payment source | Admitting: Obstetrics & Gynecology

## 2021-02-14 VITALS — BP 99/62 | HR 73 | Ht 68.0 in | Wt 150.0 lb

## 2021-02-14 DIAGNOSIS — Z3202 Encounter for pregnancy test, result negative: Secondary | ICD-10-CM

## 2021-02-14 DIAGNOSIS — R87612 Low grade squamous intraepithelial lesion on cytologic smear of cervix (LGSIL): Secondary | ICD-10-CM | POA: Diagnosis not present

## 2021-02-14 LAB — POCT URINE PREGNANCY: Preg Test, Ur: NEGATIVE

## 2021-02-14 NOTE — Progress Notes (Signed)
    Colposcopy Procedure Note:  This colposcopy was scheduled at Dr Annie Main direction   Colposcopy Procedure Note  Indications:  2022  LSIL/negative HPV 2019  negative cytology and negative HPV 2016  negative cytology and negative HPV   2019 ASCCP recommendation:  Smoker:  No. New sexual partner:  No.  : time frame:    History of abnormal Pap: no  Procedure Details  The risks and benefits of the procedure and Written informed consent obtained.  Speculum placed in vagina and excellent visualization of cervix achieved, cervix swabbed x 3 with acetic acid solution.  Findings: Adequate colposcopy is noted today.  Cervix: no visible lesions, no mosaicism, no punctation, and no abnormal vasculature; SCJ visualized 360 degrees without lesions and no biopsies taken. Vaginal inspection: vaginal colposcopy not performed. Vulvar colposcopy: vulvar colposcopy not performed.  Specimens: none  Complications: none.  Colposcopic Impression:   Plan(Based on 2019 ASCCP recommendations) Based on 2019 ASCCP criteria OK to do HPV based cytology 3 years Discussed with patient who will opt for 1 year repeat HPV based cytology

## 2021-03-02 ENCOUNTER — Encounter: Payer: Self-pay | Admitting: Psychiatry

## 2021-03-02 ENCOUNTER — Ambulatory Visit (INDEPENDENT_AMBULATORY_CARE_PROVIDER_SITE_OTHER): Payer: No Typology Code available for payment source | Admitting: Psychiatry

## 2021-03-02 ENCOUNTER — Other Ambulatory Visit: Payer: Self-pay

## 2021-03-02 ENCOUNTER — Other Ambulatory Visit (HOSPITAL_COMMUNITY): Payer: Self-pay

## 2021-03-02 DIAGNOSIS — F39 Unspecified mood [affective] disorder: Secondary | ICD-10-CM | POA: Diagnosis not present

## 2021-03-02 DIAGNOSIS — F419 Anxiety disorder, unspecified: Secondary | ICD-10-CM

## 2021-03-02 DIAGNOSIS — F5101 Primary insomnia: Secondary | ICD-10-CM

## 2021-03-02 MED ORDER — SUBVENITE 100 MG PO TABS
100.0000 mg | ORAL_TABLET | Freq: Every day | ORAL | 1 refills | Status: DC
Start: 2021-03-02 — End: 2021-03-30
  Filled 2021-03-02 – 2021-03-29 (×3): qty 90, 90d supply, fill #0

## 2021-03-02 MED ORDER — ILOPERIDONE 2 MG PO TABS
1.0000 | ORAL_TABLET | Freq: Every day | ORAL | 1 refills | Status: DC
Start: 2021-03-02 — End: 2022-08-13
  Filled 2021-03-02: qty 90, 90d supply, fill #0

## 2021-03-02 MED ORDER — QUVIVIQ 25 MG PO TABS: 25.0000 mg | ORAL_TABLET | Freq: Every day | ORAL | 1 refills | Status: DC

## 2021-03-02 MED ORDER — DIVALPROEX SODIUM ER 500 MG PO TB24
ORAL_TABLET | Freq: Every day | ORAL | 1 refills | Status: DC
Start: 2021-03-02 — End: 2021-06-19
  Filled 2021-03-02 – 2021-03-29 (×2): qty 180, 90d supply, fill #0

## 2021-03-02 NOTE — Progress Notes (Signed)
Cindy Woods [id number] 09/23/1975 45 y.o.  Subjective:   [name] is a 45 y.o. (DOB Nov 20, 1975) female.  Chief Complaint:  Chief Complaint  Patient presents with   Follow-up    Anxiety, mood disturbance, sleep disturbance     HPI Cindy Woods presents to the office today for follow-up of anxiety, mood disturbance, and sleep disturbance. She reports that she has been unable to reduce Xanax. If she takes Xanax 0.25 mg 1/2 tab she feels fine. Some nights she feels that she needs to take more than a 1/2 tab to be able to sleep and will then have a severe HA and excessive somnolence. 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 She is anxious about over-sleeping for new job. Denies any anxiety aside from issues with sleep and xanax. She reports that she sleeps ok and needs 9 hours to sleep.   Recently had elevated mood for about and was able to work and do a couple hours of school work. She reports some situational low moods and otherwise denies depressed mood. She reports having irritability at times. She reports low energy when she has taken more Xanax. She reports that her energy is good on her days off. Appetite has been ok. She reports that she can focus at work and will get easily irritated by buzzers and alarms. Denies SI.   She reports that she reached out to a therapist and has not received a response.   Reports dreading family vacation and fearful of disrupted sleep.   Past Psychiatric Medication Trials: Seroquel Viibryd Wellbutrin Klonopin Latuda Abilify Fanapt- Initially helpful at 1 mg Lithium Zoloft-Adverse effects Effexor Celexa Paxil BuSpar Lexapro Nuvigil Sonata- Was no longer effective and then was having to take more. Reports that she has had some residual grogginess the following day.   Xanax Rexulti-restlessness Ativan Hydroxyzine Trintellix Temazepam Mirtazapine- Excessive daytime somnolence.  Lamictal- anxious side effect. Prefers Subvenite Subvenite Depakote Trazodone Dayvigo- Minimally effective and excessive grogginess Gabapentin- excessive somnolence   AIMS    Flowsheet Row Office Visit from 03/02/2021 in [facility] Office Visit from 04/26/2020 in [facility] Total Score 0 0      PHQ2-9    [place] Office Visit from 01/26/2021 in [facility] Office Visit from 01/13/2018 in [facility] [facility]  PHQ-2 Total Score 1 0  PHQ-9 Total Score 8 0        Review of Systems:  Review of Systems  Musculoskeletal:  Negative for gait problem.  Neurological:  Negative for dizziness and tremors.  Psychiatric/Behavioral:         Please refer to HPI   Medications: I have reviewed the patient's current medications.  Current Outpatient Medications  Medication Sig Dispense Refill   Daridorexant HCl (QUVIVIQ) 25 MG TABS Take 25 mg by mouth at bedtime. 30 tablet 1   Melatonin 5 MG CAPS Take by mouth.     ALPRAZolam (XANAX) 0.25 MG tablet TAKE 1 TO 2 TABLETS BY MOUTH AT BEDTIME 60 tablet 2   divalproex (DEPAKOTE ER) 500 MG 24 hr tablet TAKE 2 TABLETS BY MOUTH ONCE A DAY 180 tablet 1   Iloperidone 2 MG TABS TAKE 1 TABLET BY MOUTH AT BEDTIME 90 tablet 1   SUBVENITE 100 MG tablet TAKE 1 TABLET BY MOUTH ONCE DAILY 90 tablet 1   No current facility-administered medications for this visit.    Medication Side  Effects: Other: Severe headaches and excessive somnolence after taking Xanax above 0.125 mg   Allergies: No Known Allergies  Past Medical History:  Diagnosis Date   Bipolar II disorder ([place]) 06/15/2018    Past Medical History, Surgical history, Social history, and Family history were reviewed and updated as appropriate.   Please see review of systems for further details on the patient's review from today.    Objective:   Physical Exam:  LMP 01/31/2021 (Approximate)   Physical Exam Constitutional:      General: She is not in acute distress. Musculoskeletal:        General: No deformity.  Neurological:     Mental Status: She is alert and oriented to person, place, and time.     Coordination: Coordination normal.  Psychiatric:        Attention and Perception: Attention and perception normal. She does not perceive auditory or visual hallucinations.        Mood and Affect: Mood is anxious. Mood is not depressed. Affect is tearful. Affect is not labile, blunt, angry or inappropriate.        Speech: Speech normal.        Behavior: Behavior normal.        Thought Content: Thought content normal. Thought content is not paranoid or delusional. Thought content does not include homicidal or suicidal ideation. Thought content does not include homicidal or suicidal plan.        Cognition and Memory: Cognition and memory normal.        Judgment: Judgment normal.     Comments: Insight intact    Lab Review:     Component Value Date/Time   NA 140 07/16/2018 1324   K 4.2 07/16/2018 1324   CL 102 07/16/2018 1324   CO2 24 07/16/2018 1324   GLUCOSE 82 07/16/2018 1324   BUN 15 07/16/2018 1324   CREATININE 0.88 07/16/2018 1324   CALCIUM 9.1 07/16/2018 1324   PROT 6.5 07/16/2018 1324   ALBUMIN 4.6 07/16/2018 1324   AST 11 07/16/2018 1324   ALT 9 07/16/2018 1324   ALKPHOS 37 (L) 07/16/2018 1324   BILITOT 0.3 07/16/2018 1324   GFRNONAA 82 07/16/2018 1324   GFRAA 95 07/16/2018 1324       Component Value Date/Time   WBC 5.7 07/16/2018 1324   RBC 4.10 07/16/2018 1324   HGB 13.1 07/16/2018 1324   HCT 38.8 07/16/2018 1324   PLT 186 07/16/2018 1324   MCV 95 07/16/2018 1324   MCH 32.0 07/16/2018 1324   MCHC 33.8 07/16/2018 1324   RDW 11.8 (L) 07/16/2018 1324   LYMPHSABS 2.0 07/16/2018 1324   EOSABS 0.2 07/16/2018 1324   BASOSABS 0.0 07/16/2018 1324    No results found for: POCLITH, LITHIUM    Lab Results  Component Value Date   VALPROATE 77 07/16/2018     .res Assessment: Plan:    Patient seen for 30 minutes and time spent discussing potential benefits, risks, and side effects of Quviviq.  She agrees to trial of Quviviq.  Will start Quviviq 25 mg at bedtime for insomnia. Continue Fanapt 2 mg at bedtime for insomnia, mood, and anxiety. Continue Subvenite 100 mg daily for mood signs and symptoms. Continue Depakote ER 1000 mg daily for mood signs and symptoms. Patient to follow-up in 3 months or sooner if clinically indicated. Patient advised to contact office with any questions, adverse effects, or acute worsening in signs and symptoms.   Cindy Woods was seen today for follow-up.  Diagnoses and all orders for this visit:  Primary insomnia -     Daridorexant HCl (QUVIVIQ) 25 MG TABS; Take 25 mg by mouth at bedtime. -     Iloperidone 2 MG TABS; TAKE 1 TABLET BY MOUTH AT BEDTIME  Episodic mood disorder (HCC) -     SUBVENITE 100 MG tablet; TAKE 1 TABLET BY MOUTH ONCE DAILY -     divalproex (DEPAKOTE ER) 500 MG 24 hr tablet; TAKE 2 TABLETS BY MOUTH ONCE A DAY  Anxiety disorder, unspecified type -     Iloperidone 2 MG TABS; TAKE 1 TABLET BY MOUTH AT BEDTIME    Please see After Visit Summary for patient specific instructions.  Future Appointments  Date Time Provider [place]  03/30/2021  9:20 AM [facility] 3 [facility]  06/02/2021 12:45 PM [name], PMHNP CP-[facility] None    No orders of the defined types were placed in this encounter.   -------------------------------

## 2021-03-02 NOTE — Progress Notes (Signed)
   03/02/21 1059  Facial and Oral Movements  Muscles of Facial Expression 0  Lips and Perioral Area 0  Jaw 0  Tongue 0  Extremity Movements  Upper (arms, wrists, hands, fingers) 0  Lower (legs, knees, ankles, toes) 0  Trunk Movements  Neck, shoulders, hips 0  Overall Severity  Severity of abnormal movements (highest score from questions above) 0  Incapacitation due to abnormal movements 0  Patient's awareness of abnormal movements (rate only patient's report) 0  AIMS Total Score  AIMS Total Score 0

## 2021-03-08 ENCOUNTER — Other Ambulatory Visit (HOSPITAL_COMMUNITY): Payer: Self-pay

## 2021-03-23 ENCOUNTER — Telehealth: Payer: Self-pay

## 2021-03-23 NOTE — Telephone Encounter (Signed)
Clarification: with the assistance of Cindy Woods pt can still receive medication although she has a denial.

## 2021-03-23 NOTE — Telephone Encounter (Signed)
Prior Authorization submitted for QUVIVIQ 25 MG TAB with [employer], determination for initial request is DENIED due to not meeting requirements. I will have Janett Billow review in case pt does meet their specifications. They did receive office notes for review as well.    This request has not been approved. Based on the information submitted for review, our guideline rules for the requested drug. In order for her request to be approved, her provider would need to show that she has met the guideline rules below.  For approval of Quviviq 11m tablets for the treatment of insomnia (difficult sleeping), our guideline named DARIDOREXANT ((GYKZLDJ requires that you meet the following rules:1) You have premature awakening (waking up too early) and/or abnormal sleep onset delay (cannot fall asleep) lasting 30 minutes or longer, occurring 3 or more times weekly for the last month for acute (short-term) insomnia or for at least 3 months for chronic (long-term) insomnia. 2) You have daytime impairment despite adequate time attempting to sleep and treatment of any treatable causes. 3) You are not using Quviviq at the same time with Z hypnotics (such as eszopiclone, zaleplon, zolpidem) or benzodiazepines (such as estazolam, temazepam, triazolam) for sleep. This request was denied because we did not receive information that you meet the requirements listed above. Please work with your doctor to use a different medication or get [place] information if it will allow [place] approve this request.

## 2021-03-29 ENCOUNTER — Other Ambulatory Visit (HOSPITAL_COMMUNITY): Payer: Self-pay

## 2021-03-29 ENCOUNTER — Other Ambulatory Visit: Payer: Self-pay | Admitting: Psychiatry

## 2021-03-29 DIAGNOSIS — F5101 Primary insomnia: Secondary | ICD-10-CM

## 2021-03-29 DIAGNOSIS — F419 Anxiety disorder, unspecified: Secondary | ICD-10-CM

## 2021-03-30 ENCOUNTER — Other Ambulatory Visit: Payer: Self-pay

## 2021-03-30 ENCOUNTER — Ambulatory Visit: Payer: No Typology Code available for payment source

## 2021-03-30 ENCOUNTER — Other Ambulatory Visit (HOSPITAL_COMMUNITY): Payer: Self-pay

## 2021-03-30 DIAGNOSIS — F39 Unspecified mood [affective] disorder: Secondary | ICD-10-CM

## 2021-03-30 MED ORDER — SUBVENITE 100 MG PO TABS: 100.0000 mg | ORAL_TABLET | Freq: Every day | ORAL | 1 refills | Status: DC

## 2021-03-30 NOTE — Telephone Encounter (Signed)
Pharmacy not left in message but submitted to [facility]

## 2021-03-30 NOTE — Telephone Encounter (Signed)
Pt called with refill request for Subvenite '100mg'$ . States she needs it by August 31st as her insurance will change. Pls rtc [phone].

## 2021-03-31 ENCOUNTER — Other Ambulatory Visit (HOSPITAL_COMMUNITY): Payer: Self-pay

## 2021-03-31 ENCOUNTER — Other Ambulatory Visit: Payer: Self-pay | Admitting: Psychiatry

## 2021-03-31 ENCOUNTER — Other Ambulatory Visit: Payer: Self-pay

## 2021-03-31 ENCOUNTER — Ambulatory Visit
Admission: RE | Admit: 2021-03-31 | Discharge: 2021-03-31 | Disposition: A | Discharge status: Home / Self Care | Payer: No Typology Code available for payment source | Source: Ambulatory Visit | Attending: Obstetrics & Gynecology | Admitting: Obstetrics & Gynecology

## 2021-03-31 DIAGNOSIS — F419 Anxiety disorder, unspecified: Secondary | ICD-10-CM

## 2021-03-31 DIAGNOSIS — Z1231 Encounter for screening mammogram for malignant neoplasm of breast: Secondary | ICD-10-CM

## 2021-03-31 DIAGNOSIS — F5101 Primary insomnia: Secondary | ICD-10-CM

## 2021-03-31 MED ORDER — FANAPT 2 MG PO TABS
1.0000 | ORAL_TABLET | Freq: Every day | ORAL | 1 refills | Status: DC
  Filled 2021-03-31: qty 90, 90d supply, fill #0

## 2021-03-31 NOTE — Telephone Encounter (Signed)
Cindy Woods called, [phone], and said that she is trying to get the Fanapt, Iloperidone, Refilled. Her insur changes on Sept 1 and it will be going up in price. She'd like to get her refill before then. Can we ok her refill at [facility]?

## 2021-04-01 ENCOUNTER — Other Ambulatory Visit (HOSPITAL_COMMUNITY): Payer: Self-pay

## 2021-04-03 ENCOUNTER — Other Ambulatory Visit (HOSPITAL_COMMUNITY): Payer: Self-pay

## 2021-04-07 ENCOUNTER — Telehealth: Payer: Self-pay | Admitting: Obstetrics & Gynecology

## 2021-04-07 NOTE — Telephone Encounter (Signed)
Patient is calling has question about the mammo gram thought she was suppose to be getting a [place] with it as well

## 2021-04-14 ENCOUNTER — Other Ambulatory Visit (HOSPITAL_COMMUNITY): Payer: Self-pay

## 2021-04-17 ENCOUNTER — Other Ambulatory Visit (HOSPITAL_COMMUNITY): Payer: Self-pay

## 2021-05-08 ENCOUNTER — Ambulatory Visit: Payer: No Typology Code available for payment source | Admitting: Psychiatry

## 2021-05-23 ENCOUNTER — Other Ambulatory Visit: Payer: Self-pay

## 2021-05-23 ENCOUNTER — Ambulatory Visit: Payer: 59 | Admitting: Psychiatry

## 2021-06-02 ENCOUNTER — Ambulatory Visit: Payer: No Typology Code available for payment source | Admitting: Psychiatry

## 2021-06-08 ENCOUNTER — Ambulatory Visit: Payer: No Typology Code available for payment source | Admitting: Psychiatry

## 2021-06-16 ENCOUNTER — Ambulatory Visit (INDEPENDENT_AMBULATORY_CARE_PROVIDER_SITE_OTHER): Payer: 59 | Admitting: Psychiatry

## 2021-06-16 ENCOUNTER — Encounter: Payer: Self-pay | Admitting: Psychiatry

## 2021-06-16 DIAGNOSIS — Z79899 Other long term (current) drug therapy: Secondary | ICD-10-CM

## 2021-06-16 NOTE — Progress Notes (Signed)
Cindy Woods [id number] [birth year] 45 y.o.  Virtual Visit via Telephone Note  I connected with pt on 06/16/21 at  9:30 AM EST by telephone and verified that I am speaking with the correct person using two identifiers.   I discussed the limitations, risks, security and privacy concerns of performing an evaluation and management service by telephone and the availability of in person appointments. I also discussed with the patient that there may be a patient responsible charge related to this service. The patient expressed understanding and agreed to proceed.   I discussed the assessment and treatment plan with the patient. The patient was provided an opportunity to ask questions and all were answered. The patient agreed with the plan and demonstrated an understanding of the instructions.   The patient was advised to call back or seek an in-person evaluation if the symptoms worsen or if the condition fails to improve as anticipated.  I provided 18 minutes of non-face-to-face time during this encounter.  The patient was located at home.  The provider was located at [place].   [name], PMHNP   Subjective:   [name] is a 45 y.o. (DOB 08-30-1975) female.  Chief Complaint:  Chief Complaint  Patient presents with   Anxiety    HPI Cindy Woods presents for follow-up of anxiety and h/o mood disturbance. She reports that she has been doing ok overall. She reports that she used to have a "hangover with Xanax" and has not experienced this recently. She is taking a 1/2-1 tab of Xanax at night. She reports falling asleep ok. She reports that she is late daily for work due to hypersomnolence. She reports that her stress has been minimal and anxiety has been ok. She reports some OCD behaviors with checking alarm clocks at night and her door. Mood has been "fine." Energy and motivation have been ok. She reports concentration has  been adequate. Denies SI.   Reports new job is going ok.   She received Quviviq and has not yet tried it.   Past Psychiatric Medication Trials: Seroquel Viibryd Wellbutrin Klonopin Latuda Abilify Fanapt- Initially helpful at 1 mg Lithium Zoloft-Adverse effects Effexor Celexa Paxil BuSpar Lexapro Nuvigil Sonata- Was no longer effective and then was having to take more. Reports that she has had some residual grogginess the following day.  Xanax Rexulti-restlessness Ativan Hydroxyzine Trintellix Temazepam Mirtazapine- Excessive daytime somnolence.  Lamictal- anxious side effect.  Cindy Woods has been more effective and better tolerated Cindy Woods- more effective and better tolerated compared to generic Depakote Trazodone Dayvigo- Minimally effective and excessive grogginess Gabapentin- excessive somnolence  Review of Systems:  Review of Systems  HENT:  Positive for congestion.   Musculoskeletal:  Negative for gait problem.  Psychiatric/Behavioral:         Please refer to HPI   Medications: I have reviewed the patient's current medications.  Current Outpatient Medications  Medication Sig Dispense Refill   Iloperidone (FANAPT) 2 MG TABS TAKE 1 TABLET BY MOUTH AT BEDTIME 90 tablet 1   Melatonin 1 MG CAPS Take by mouth.     Cindy Woods 100 MG tablet TAKE 1 TABLET BY MOUTH ONCE DAILY 90 tablet 1   ALPRAZolam (XANAX) 0.25 MG tablet TAKE 1 TO 2 TABLETS BY MOUTH AT BEDTIME 60 tablet 2   Daridorexant HCl (QUVIVIQ) 25 MG TABS Take 25 mg by mouth at bedtime. (Patient not taking: Reported on 06/16/2021) 30 tablet 1   divalproex (DEPAKOTE ER) 500 MG 24 hr  tablet TAKE 2 TABLETS BY MOUTH ONCE A DAY 180 tablet 1   Iloperidone 2 MG TABS TAKE 1 TABLET BY MOUTH AT BEDTIME 90 tablet 1   No current facility-administered medications for this visit.    Medication Side Effects: Other: Excessive somnolence upon awakening. Some difficulty retrieving names.  Allergies: No Known  Allergies  Past Medical History:  Diagnosis Date   Bipolar II disorder ([place]) 06/15/2018    Family History  Problem Relation Age of Onset   Breast cancer Maternal Grandmother    Melanoma Father    Anxiety disorder Brother    Depression Sister     Social History   Socioeconomic History   Marital status: Single    Spouse name: Not on file   Number of children: Not on file   Years of education: Not on file   Highest education level: Not on file  Occupational History   Not on file  Tobacco Use   Smoking status: Never   Smokeless tobacco: Never  Vaping Use   Vaping Use: Never used  Substance and Sexual Activity   Alcohol use: Yes    Comment: Rare- "once a month"   Drug use: Never   Sexual activity: Not Currently    Birth control/protection: None  Other Topics Concern   Not on file  Social History Narrative   Not on file   Social Determinants of Health   Financial Resource Strain: Low Risk    Difficulty of Paying Living Expenses: Not hard at all  Food Insecurity: No Food Insecurity   Worried About Charity fundraiser in the Last Year: Never true   Ran Out of Food in the Last Year: Never true  Transportation Needs: No Transportation Needs   Lack of Transportation (Medical): No   Lack of Transportation (Non-Medical): No  Physical Activity: Insufficiently Active   Days of Exercise per Week: 3 days   Minutes of Exercise per Session: 30 min  Stress: Stress Concern Present   Feeling of Stress : Rather much  Social Connections: Moderately Integrated   Frequency of Communication with Friends and Family: Three times a week   Frequency of Social Gatherings with Friends and Family: Once a week   Attends Religious Services: More than 4 times per year   Active Member of [employer] or Organizations: No   Attends Music therapist: More than 4 times per year   Marital Status: Never married  Human resources officer Violence: Not At Risk   Fear of Current or Ex-Partner: No    Emotionally Abused: No   Physically Abused: No   Sexually Abused: No    Past Medical History, Surgical history, Social history, and Family history were reviewed and updated as appropriate.   Please see review of systems for further details on the patient's review from today.   Objective:   Physical Exam:  There were no vitals taken for this visit.  Physical Exam Neurological:     Mental Status: She is alert and oriented to person, place, and time.     Cranial Nerves: No dysarthria.  Psychiatric:        Attention and Perception: Attention and perception normal.        Mood and Affect: Mood is not anxious.        Speech: Speech normal.        Behavior: Behavior is cooperative.        Thought Content: Thought content normal. Thought content is not paranoid or delusional. Thought content  does not include homicidal or suicidal ideation. Thought content does not include homicidal or suicidal plan.        Cognition and Memory: Cognition and memory normal.        Judgment: Judgment normal.     Comments: Insight intact Mildly dysphoric mood    Lab Review:     Component Value Date/Time   NA 140 07/16/2018 1324   K 4.2 07/16/2018 1324   CL 102 07/16/2018 1324   CO2 24 07/16/2018 1324   GLUCOSE 82 07/16/2018 1324   BUN 15 07/16/2018 1324   CREATININE 0.88 07/16/2018 1324   CALCIUM 9.1 07/16/2018 1324   PROT 6.5 07/16/2018 1324   ALBUMIN 4.6 07/16/2018 1324   AST 11 07/16/2018 1324   ALT 9 07/16/2018 1324   ALKPHOS 37 (L) 07/16/2018 1324   BILITOT 0.3 07/16/2018 1324   GFRNONAA 82 07/16/2018 1324   GFRAA 95 07/16/2018 1324       Component Value Date/Time   WBC 5.7 07/16/2018 1324   RBC 4.10 07/16/2018 1324   HGB 13.1 07/16/2018 1324   HCT 38.8 07/16/2018 1324   PLT 186 07/16/2018 1324   MCV 95 07/16/2018 1324   MCH 32.0 07/16/2018 1324   MCHC 33.8 07/16/2018 1324   RDW 11.8 (L) 07/16/2018 1324   LYMPHSABS 2.0 07/16/2018 1324   EOSABS 0.2 07/16/2018 1324   BASOSABS  0.0 07/16/2018 1324    No results found for: POCLITH, LITHIUM   Lab Results  Component Value Date   VALPROATE 77 07/16/2018     .res Assessment: Plan:   Cindy Woods reports that overall symptoms are better controlled at this time compared to the past and would like to continue current medications without changes. She reports that she received Quviviq and has not yet taken it but may try it if needed.  Discussed obtaining labs to monitor for potential adverse effects. VPA, CBC, and CMP ordered.  Cindy Woods reports that her insurance has changed and that she will need to use a mail order pharmacy for medication and is not sure which mail order pharmacy she will need to use. She reports that she will contact office once she finds out which mail order pharmacy she will be using. Recommended that she inquire about pharmacy information as soon as possible before running out of medication since Cindy Woods and Cindy Woods may require prior authorization with new insurance. Will send in refills once pt informs office which mail order pharmacy she will be using.  Continue Depakote ER 1000 mg po QHS for mood stabilization. Continue Fanapt 2 mg po QHS for anxiety and since this has been helpful for sleep. Continue Cindy Woods 100 mg po qd for mood s/s.  Continue Xanax 0.25 mg 1/2-2 tabs po QHS for anxiety and sleep initiation. Pt to follow-up in 6 months or sooner if clinically indicated.   Patient advised to contact office with any questions, adverse effects, or acute worsening in signs and symptoms.   Cindy Woods was seen today for anxiety.  Diagnoses and all orders for this visit:  High risk medication use -     Comprehensive metabolic panel -     Valproic acid level -     CBC With Differential   Please see After Visit Summary for patient specific instructions.  Future Appointments  Date Time Provider [place]  06/19/2021  3:00 PM Shanon Ace, LCSW CP-CP None    Orders Placed This Encounter   Procedures   Comprehensive metabolic panel   Valproic acid  level   CBC With Differential      -------------------------------

## 2021-06-19 ENCOUNTER — Ambulatory Visit: Payer: No Typology Code available for payment source | Admitting: Psychiatry

## 2021-06-19 ENCOUNTER — Other Ambulatory Visit: Payer: Self-pay

## 2021-06-19 ENCOUNTER — Telehealth: Payer: Self-pay | Admitting: Psychiatry

## 2021-06-19 ENCOUNTER — Other Ambulatory Visit (HOSPITAL_COMMUNITY): Payer: Self-pay

## 2021-06-19 DIAGNOSIS — F419 Anxiety disorder, unspecified: Secondary | ICD-10-CM

## 2021-06-19 DIAGNOSIS — F5101 Primary insomnia: Secondary | ICD-10-CM

## 2021-06-19 DIAGNOSIS — F39 Unspecified mood [affective] disorder: Secondary | ICD-10-CM

## 2021-06-19 MED ORDER — FANAPT 2 MG PO TABS: 1.0000 | ORAL_TABLET | Freq: Every day | ORAL | 1 refills | Status: DC

## 2021-06-19 MED ORDER — DIVALPROEX SODIUM ER 500 MG PO TB24: ORAL_TABLET | Freq: Every day | ORAL | 1 refills | Status: DC

## 2021-06-19 MED ORDER — ALPRAZOLAM 0.25 MG PO TABS: ORAL_TABLET | Freq: Every day | ORAL | 2 refills | Status: DC

## 2021-06-19 MED ORDER — SUBVENITE 100 MG PO TABS: 100.0000 mg | ORAL_TABLET | Freq: Every day | ORAL | 1 refills | Status: DC

## 2021-06-19 NOTE — Telephone Encounter (Signed)
Pt called reporting all meds need new Rx to [facility] mail order due to insurance. [facility] # [phone]

## 2021-06-19 NOTE — Telephone Encounter (Signed)
Pended.

## 2021-06-20 ENCOUNTER — Other Ambulatory Visit (HOSPITAL_COMMUNITY): Payer: Self-pay

## 2021-06-20 MED ORDER — BENZONATATE 100 MG PO CAPS
100.0000 mg | ORAL_CAPSULE | Freq: Three times a day (TID) | ORAL | 0 refills | Status: DC | PRN
  Filled 2021-06-20: qty 30, 10d supply, fill #0

## 2021-06-21 LAB — COMPREHENSIVE METABOLIC PANEL
ALT: 8 IU/L (ref 0–32)
AST: 10 IU/L (ref 0–40)
Albumin/Globulin Ratio: 1.9 (ref 1.2–2.2)
Albumin: 4.3 g/dL (ref 3.8–4.8)
Alkaline Phosphatase: 50 IU/L (ref 44–121)
BUN/Creatinine Ratio: 16 (ref 9–23)
BUN: 12 mg/dL (ref 6–24)
Bilirubin Total: <0.2 mg/dL (ref 0.0–1.2)
CO2: 24 mmol/L (ref 20–29)
Calcium: 9 mg/dL (ref 8.7–10.2)
Chloride: 103 mmol/L (ref 96–106)
Creatinine, Ser: 0.77 mg/dL (ref 0.57–1.00)
Globulin, Total: 2.3 g/dL (ref 1.5–4.5)
Glucose: 106 mg/dL — ABNORMAL HIGH (ref 70–99)
Potassium: 4.3 mmol/L (ref 3.5–5.2)
Sodium: 141 mmol/L (ref 134–144)
Total Protein: 6.6 g/dL (ref 6.0–8.5)
eGFR: 98 mL/min/{1.73_m2} (ref >59)

## 2021-06-21 LAB — VALPROIC ACID LEVEL: Valproic Acid Lvl: 65 ug/mL (ref 50–100)

## 2021-10-16 ENCOUNTER — Telehealth: Payer: Self-pay | Admitting: Psychiatry

## 2021-10-16 ENCOUNTER — Other Ambulatory Visit: Payer: Self-pay | Admitting: Psychiatry

## 2021-10-16 ENCOUNTER — Other Ambulatory Visit (HOSPITAL_COMMUNITY): Payer: Self-pay

## 2021-10-16 DIAGNOSIS — F39 Unspecified mood [affective] disorder: Secondary | ICD-10-CM

## 2021-10-16 MED ORDER — SUBVENITE 100 MG PO TABS
100.0000 mg | ORAL_TABLET | Freq: Every day | ORAL | 1 refills | Status: DC
  Filled 2021-10-16: qty 30, 30d supply, fill #0
  Filled 2021-11-08: qty 30, 30d supply, fill #1
  Filled 2021-12-13: qty 30, 30d supply, fill #2

## 2021-10-16 NOTE — Telephone Encounter (Signed)
Pended will cancel mail order

## 2021-10-16 NOTE — Telephone Encounter (Signed)
Please approve will cancel mail order they are out of stock

## 2021-10-16 NOTE — Telephone Encounter (Signed)
Pt called at 4:59 pm and said that [facility] is out of stock of her subvente. So we need to cancel refill and send to [place] out pt pharmacy. Pt is out ?

## 2021-10-17 ENCOUNTER — Other Ambulatory Visit (HOSPITAL_COMMUNITY): Payer: Self-pay

## 2021-11-08 ENCOUNTER — Other Ambulatory Visit (HOSPITAL_COMMUNITY): Payer: Self-pay

## 2021-11-09 ENCOUNTER — Other Ambulatory Visit (HOSPITAL_COMMUNITY): Payer: Self-pay

## 2021-12-01 ENCOUNTER — Other Ambulatory Visit (HOSPITAL_COMMUNITY): Payer: Self-pay

## 2021-12-01 MED ORDER — METRONIDAZOLE 500 MG PO TABS
ORAL_TABLET | ORAL | 0 refills | Status: DC
  Filled 2021-12-01: qty 14, 7d supply, fill #0

## 2021-12-11 ENCOUNTER — Other Ambulatory Visit: Payer: Self-pay | Admitting: Psychiatry

## 2021-12-11 DIAGNOSIS — F39 Unspecified mood [affective] disorder: Secondary | ICD-10-CM

## 2021-12-13 ENCOUNTER — Other Ambulatory Visit (HOSPITAL_COMMUNITY): Payer: Self-pay

## 2021-12-14 ENCOUNTER — Other Ambulatory Visit (HOSPITAL_COMMUNITY): Payer: Self-pay

## 2021-12-20 ENCOUNTER — Other Ambulatory Visit: Payer: Self-pay | Admitting: Psychiatry

## 2021-12-20 DIAGNOSIS — F419 Anxiety disorder, unspecified: Secondary | ICD-10-CM

## 2021-12-20 DIAGNOSIS — F5101 Primary insomnia: Secondary | ICD-10-CM

## 2021-12-21 NOTE — Telephone Encounter (Signed)
Received refill request for patient. Patient is overdue for follow-up. Please call to schedule follow-up appointment.  

## 2021-12-22 ENCOUNTER — Telehealth: Payer: Self-pay | Admitting: Psychiatry

## 2021-12-22 NOTE — Telephone Encounter (Signed)
Patient scheduled for 6/5

## 2021-12-22 NOTE — Telephone Encounter (Signed)
Spoke with regarding appt scheduling and she inquired about her prescriptions. I informed her that had been sent to her pharmacy. She mentioned her Subvenite '100mg'$  prescription she asked if it was possible to get a three month supply instead of a 30 day supply. Its not due yet but when it is please send "three month" supply to [place], [place]. Ph: [phone] VACQ 5/8

## 2021-12-22 NOTE — Telephone Encounter (Signed)
Noted  

## 2022-01-08 ENCOUNTER — Other Ambulatory Visit (HOSPITAL_COMMUNITY): Payer: Self-pay

## 2022-01-08 ENCOUNTER — Encounter: Payer: Self-pay | Admitting: Psychiatry

## 2022-01-08 ENCOUNTER — Telehealth (INDEPENDENT_AMBULATORY_CARE_PROVIDER_SITE_OTHER): Payer: 59 | Admitting: Psychiatry

## 2022-01-08 DIAGNOSIS — F419 Anxiety disorder, unspecified: Secondary | ICD-10-CM

## 2022-01-08 DIAGNOSIS — F5101 Primary insomnia: Secondary | ICD-10-CM | POA: Diagnosis not present

## 2022-01-08 DIAGNOSIS — F39 Unspecified mood [affective] disorder: Secondary | ICD-10-CM

## 2022-01-08 MED ORDER — FANAPT 2 MG PO TABS: 1.0000 | ORAL_TABLET | Freq: Every day | ORAL | 0 refills | Status: DC

## 2022-01-08 MED ORDER — SUBVENITE 100 MG PO TABS
100.0000 mg | ORAL_TABLET | Freq: Every day | ORAL | 1 refills | Status: DC
  Filled 2022-01-08: qty 30, 30d supply, fill #0
  Filled 2022-02-08: qty 30, 30d supply, fill #1
  Filled 2022-03-09: qty 30, 30d supply, fill #2
  Filled 2022-04-12: qty 30, 30d supply, fill #3
  Filled 2022-05-11: qty 30, 30d supply, fill #4
  Filled 2022-06-11: qty 30, 30d supply, fill #5

## 2022-01-08 MED ORDER — ALPRAZOLAM 0.25 MG PO TABS: ORAL_TABLET | ORAL | 4 refills | Status: DC

## 2022-01-08 NOTE — Progress Notes (Signed)
Cindy Woods [id number] 10/24/1975 46 y.o.  Virtual Visit via Video Note  I connected with pt @ on 01/08/22 at  3:30 PM EDT by a video enabled telemedicine application and verified that I am speaking with the correct person using two identifiers.   I discussed the limitations of evaluation and management by telemedicine and the availability of in person appointments. The patient expressed understanding and agreed to proceed.  I discussed the assessment and treatment plan with the patient. The patient was provided an opportunity to ask questions and all were answered. The patient agreed with the plan and demonstrated an understanding of the instructions.   The patient was advised to call back or seek an in-person evaluation if the symptoms worsen or if the condition fails to improve as anticipated.  I provided 20 minutes of non-face-to-face time during this encounter.  The patient was located at home.  The provider was located at home.   [name], PMHNP   Subjective:   [name] is a 46 y.o. (DOB Sep 24, 1975) female.  Chief Complaint:  Chief Complaint  Patient presents with   Follow-up    Mood disturbance, anxiety, insomnia    HPI Cindy Woods presents for follow-up of mood disturbance and anxiety. She reports that she had some depression last weekend. Reports that she has minimal depressed mood at this time. She reports that, "I always have irritability." She reports irritability in response to certain noises, such as people eating. Denies excessive anxiety. She reports that she broke out in hives when she traveled to [place] recently and thinks it was stress related. Denies any h/o hives due to stress. Denies any panic attacks. Sleeping well with medication. Appetite has been ok. Energy has been ok. Motivation is fair to low and consistent with baseline. Denies difficulty with concentration. Denies SI.   She reports that she  typically takes a 1/2 of Alprazolam 0.25 mg at bedtime. "I'm psychologically dependent on it." She reports that she may need to start taking more if she feels "hyper... more physical energy." She reports that this has not happened recently.   She is now working as a Government social research officer. Completing coursework for [employer].   She reports that she has not been able to get Subvenite through [facility].   Denies any involuntary movements.  Past Psychiatric Medication Trials: Seroquel Viibryd Wellbutrin Klonopin Latuda Abilify Fanapt- Initially helpful at 1 mg Lithium Zoloft-Adverse effects Effexor Celexa Paxil BuSpar Lexapro Nuvigil Sonata- Was no longer effective and then was having to take more. Reports that she has had some residual grogginess the following day.  Xanax Rexulti-restlessness Ativan Hydroxyzine Trintellix Temazepam Mirtazapine- Excessive daytime somnolence.  Lamictal- anxious side effect.  Subvenite has been more effective and better tolerated Subvenite- more effective and better tolerated compared to generic Depakote Trazodone Dayvigo- Minimally effective and excessive grogginess Gabapentin- excessive somnolence  Review of Systems:  Review of Systems  Musculoskeletal:  Negative for gait problem.  Neurological:  Negative for tremors.       Denies involuntary movements  Psychiatric/Behavioral:         Please refer to HPI   Medications: I have reviewed the patient's current medications.  Current Outpatient Medications  Medication Sig Dispense Refill   divalproex (DEPAKOTE ER) 500 MG 24 hr tablet TAKE 2 TABLETS BY MOUTH ONCE  DAILY 180 tablet 3   Melatonin 1 MG CAPS Take by mouth.     SUBVENITE 100 MG tablet TAKE 1 TABLET BY  MOUTH ONCE DAILY 90 tablet 1   [START ON 01/19/2022] ALPRAZolam (XANAX) 0.25 MG tablet TAKE 1 TO 2 TABLETS BY MOUTH AT  BEDTIME 60 tablet 4   Iloperidone (FANAPT) 2 MG TABS Take 1 tablet (2 mg total) by mouth at bedtime. 90 tablet 0   Iloperidone  2 MG TABS TAKE 1 TABLET BY MOUTH AT BEDTIME 90 tablet 1   metroNIDAZOLE (FLAGYL) 500 MG tablet Take 1 tablet by mouth twice a day for 7 days (Patient not taking: Reported on 01/08/2022) 14 tablet 0   SUBVENITE 100 MG tablet TAKE 1 TABLET BY MOUTH ONCE DAILY 90 tablet 1   No current facility-administered medications for this visit.    Medication Side Effects: None  Allergies: No Known Allergies  Past Medical History:  Diagnosis Date   Bipolar II disorder ([place]) 06/15/2018    Family History  Problem Relation Age of Onset   Breast cancer Maternal Grandmother    Melanoma Father    Anxiety disorder Brother    Depression Sister     Social History   Socioeconomic History   Marital status: Single    Spouse name: Not on file   Number of children: Not on file   Years of education: Not on file   Highest education level: Not on file  Occupational History   Not on file  Tobacco Use   Smoking status: Never   Smokeless tobacco: Never  Vaping Use   Vaping Use: Never used  Substance and Sexual Activity   Alcohol use: Yes    Comment: Rare- "once a month"   Drug use: Never   Sexual activity: Not Currently    Birth control/protection: None  Other Topics Concern   Not on file  Social History Narrative   Not on file   Social Determinants of Health   Financial Resource Strain: Low Risk    Difficulty of Paying Living Expenses: Not hard at all  Food Insecurity: No Food Insecurity   Worried About Charity fundraiser in the Last Year: Never true   Ran Out of Food in the Last Year: Never true  Transportation Needs: No Transportation Needs   Lack of Transportation (Medical): No   Lack of Transportation (Non-Medical): No  Physical Activity: Insufficiently Active   Days of Exercise per Week: 3 days   Minutes of Exercise per Session: 30 min  Stress: Stress Concern Present   Feeling of Stress : Rather much  Social Connections: Moderately Integrated   Frequency of Communication with  Friends and Family: Three times a week   Frequency of Social Gatherings with Friends and Family: Once a week   Attends Religious Services: More than 4 times per year   Active Member of [employer] or Organizations: No   Attends Music therapist: More than 4 times per year   Marital Status: Never married  Human resources officer Violence: Not At Risk   Fear of Current or Ex-Partner: No   Emotionally Abused: No   Physically Abused: No   Sexually Abused: No    Past Medical History, Surgical history, Social history, and Family history were reviewed and updated as appropriate.   Please see review of systems for further details on the patient's review from today.   Objective:   Physical Exam:  There were no vitals taken for this visit.  Physical Exam Neurological:     Mental Status: She is alert and oriented to person, place, and time.     Cranial Nerves: No dysarthria.  Psychiatric:        Attention and Perception: Attention and perception normal.        Speech: Speech normal.        Behavior: Behavior is cooperative.        Thought Content: Thought content normal. Thought content is not paranoid or delusional. Thought content does not include homicidal or suicidal ideation. Thought content does not include homicidal or suicidal plan.        Cognition and Memory: Cognition and memory normal.        Judgment: Judgment normal.     Comments: Insight intact Mood is mildly dysphoric    Lab Review:     Component Value Date/Time   NA 141 06/19/2021 1611   K 4.3 06/19/2021 1611   CL 103 06/19/2021 1611   CO2 24 06/19/2021 1611   GLUCOSE 106 (H) 06/19/2021 1611   BUN 12 06/19/2021 1611   CREATININE 0.77 06/19/2021 1611   CALCIUM 9.0 06/19/2021 1611   PROT 6.6 06/19/2021 1611   ALBUMIN 4.3 06/19/2021 1611   AST 10 06/19/2021 1611   ALT 8 06/19/2021 1611   ALKPHOS 50 06/19/2021 1611   BILITOT <0.2 06/19/2021 1611   GFRNONAA 82 07/16/2018 1324   GFRAA 95 07/16/2018 1324        Component Value Date/Time   WBC 5.7 07/16/2018 1324   RBC 4.10 07/16/2018 1324   HGB 13.1 07/16/2018 1324   HCT 38.8 07/16/2018 1324   PLT 186 07/16/2018 1324   MCV 95 07/16/2018 1324   MCH 32.0 07/16/2018 1324   MCHC 33.8 07/16/2018 1324   RDW 11.8 (L) 07/16/2018 1324   LYMPHSABS 2.0 07/16/2018 1324   EOSABS 0.2 07/16/2018 1324   BASOSABS 0.0 07/16/2018 1324    No results found for: POCLITH, LITHIUM   Lab Results  Component Value Date   VALPROATE 65 06/19/2021     .res Assessment: Plan:   Reviewed labs from PCP from 11/30/21- CBC and LFT's WNL. Pt prefers to continue Subvenite if possible and reports that this has been unavailable through [facility] and requests 90-day supply be sent to [facility]. Script for 90-day supply of Subvenite 100 mg po qd sent to [facility]. Continue Fanapt 2 mg po QHS for mood, anxiety, and insomnia.  Continue Alprazolam 0.25 mg 1-2 tabs po QHS for anxiety and/or insomnia.  Pt to follow-up in 6 months or sooner if clinically indicated.  Patient advised to contact office with any questions, adverse effects, or acute worsening in signs and symptoms.   Quisha was seen today for follow-up.  Diagnoses and all orders for this visit:  Episodic mood disorder (HCC) -     SUBVENITE 100 MG tablet; TAKE 1 TABLET BY MOUTH ONCE DAILY  Primary insomnia -     Iloperidone (FANAPT) 2 MG TABS; Take 1 tablet (2 mg total) by mouth at bedtime. -     ALPRAZolam (XANAX) 0.25 MG tablet; TAKE 1 TO 2 TABLETS BY MOUTH AT  BEDTIME  Anxiety disorder, unspecified type -     Iloperidone (FANAPT) 2 MG TABS; Take 1 tablet (2 mg total) by mouth at bedtime. -     ALPRAZolam (XANAX) 0.25 MG tablet; TAKE 1 TO 2 TABLETS BY MOUTH AT  BEDTIME     Please see After Visit Summary for patient specific instructions.  Future Appointments  Date Time Provider [place]  02/01/2022  2:30 PM Janyth Pupa, DO [facility]  03/06/2022  4:00 PM  [name], [facility] CP-CP None    No orders of the defined types were placed in this encounter.     -------------------------------

## 2022-01-09 ENCOUNTER — Other Ambulatory Visit (HOSPITAL_COMMUNITY): Payer: Self-pay

## 2022-01-26 ENCOUNTER — Other Ambulatory Visit (HOSPITAL_COMMUNITY): Payer: Self-pay

## 2022-01-26 ENCOUNTER — Encounter: Payer: Self-pay | Admitting: Psychiatry

## 2022-01-26 ENCOUNTER — Ambulatory Visit (INDEPENDENT_AMBULATORY_CARE_PROVIDER_SITE_OTHER): Payer: 59 | Admitting: Psychiatry

## 2022-01-26 DIAGNOSIS — F419 Anxiety disorder, unspecified: Secondary | ICD-10-CM

## 2022-01-26 DIAGNOSIS — F39 Unspecified mood [affective] disorder: Secondary | ICD-10-CM

## 2022-01-26 MED ORDER — AUVELITY 45-105 MG PO TBCR
1.0000 | EXTENDED_RELEASE_TABLET | Freq: Two times a day (BID) | ORAL | 1 refills | Status: DC
  Filled 2022-01-26: qty 60, 30d supply, fill #0

## 2022-01-26 MED ORDER — AUVELITY 45-105 MG PO TBCR: EXTENDED_RELEASE_TABLET | ORAL | 0 refills | Status: DC

## 2022-01-26 NOTE — Progress Notes (Signed)
Cindy Woods [id number] [birth year] 46 y.o.  Subjective:   [name] is a 46 y.o. (DOB 1976/01/22) female.  Chief Complaint:  Chief Complaint  Patient presents with   Depression    HPI Cindy Woods presents to the office emergently today for depression. She reports, "I've been really depressed." She reports that she feels like "everything is a chore" when depression is more severe. She reports that depression is worse at times than others. She reports that she is less depressed today. Denies any change in irritability. Denies anxiety.   She reports that she had difficulty waking up this morning and took Xanax 0.25 mg last night which is more than she typically takes. Sleeping ok. Appetite has been ok. Has been wanting to eat more junk food. She reports concentration is fair. She reports occasional suicidal thoughts. Denies suicidal intent- "I would never commit suicide."   Past Psychiatric Medication Trials: Seroquel Viibryd Wellbutrin Klonopin Latuda Abilify Fanapt- Initially helpful at 1 mg Lithium Zoloft-Adverse effects Effexor- May have been helpful Celexa Paxil Prozac BuSpar Lexapro Nuvigil Sonata- Was no longer effective and then was having to take more. Reports that she has had some residual grogginess the following day.  Xanax Rexulti-restlessness Ativan Hydroxyzine Trintellix Temazepam Mirtazapine- Excessive daytime somnolence.  Lamictal- anxious side effect.  Subvenite has been more effective and better tolerated Subvenite- more effective and better tolerated compared to generic Depakote Trazodone Dayvigo- Minimally effective and excessive grogginess Gabapentin- excessive somnolence  AIMS    Flowsheet Row Office Visit from 03/02/2021 in [facility] Office Visit from 04/26/2020 in [facility]  AIMS Total Score 0 0      PHQ2-9    Flowsheet Row Office Visit from 01/26/2021  in [facility] Office Visit from 01/13/2018 in [place]  PHQ-2 Total Score 1 0  PHQ-9 Total Score 8 0        Review of Systems:  Review of Systems  Musculoskeletal:  Negative for gait problem.  Neurological:  Negative for tremors.  Psychiatric/Behavioral:         Please refer to HPI    Medications: I have reviewed the patient's current medications.  Current Outpatient Medications  Medication Sig Dispense Refill   Dextromethorphan-buPROPion ER (AUVELITY) 45-105 MG TBCR Take 1 tablet for 3 days, then increase to twice daily 60 tablet 0   Dextromethorphan-buPROPion ER (AUVELITY) 45-105 MG TBCR Take 1 tablet by mouth 2 (two) times daily. 60 tablet 1   ALPRAZolam (XANAX) 0.25 MG tablet TAKE 1 TO 2 TABLETS BY MOUTH AT  BEDTIME 60 tablet 4   divalproex (DEPAKOTE ER) 500 MG 24 hr tablet TAKE 2 TABLETS BY MOUTH ONCE  DAILY 180 tablet 3   Iloperidone (FANAPT) 2 MG TABS Take 1 tablet (2 mg total) by mouth at bedtime. 90 tablet 0   Iloperidone 2 MG TABS TAKE 1 TABLET BY MOUTH AT BEDTIME 90 tablet 1   Melatonin 1 MG CAPS Take by mouth.     metroNIDAZOLE (FLAGYL) 500 MG tablet Take 1 tablet by mouth twice a day for 7 days (Patient not taking: Reported on 01/08/2022) 14 tablet 0   SUBVENITE 100 MG tablet TAKE 1 TABLET BY MOUTH ONCE DAILY 90 tablet 1   SUBVENITE 100 MG tablet TAKE 1 TABLET BY MOUTH ONCE DAILY 90 tablet 1   No current facility-administered medications for this visit.    Medication Side Effects: None  Allergies: No Known Allergies  Past Medical History:  Diagnosis Date   Bipolar II disorder (HCC) 06/15/2018    Past Medical History, Surgical history, Social history, and Family history were reviewed and updated as appropriate.   Please see review of systems for further details on the patient's review from today.   Objective:   Physical Exam:  There were no vitals taken for this visit.  Physical Exam Neurological:     Mental Status: She is alert and oriented  to person, place, and time.     Cranial Nerves: No dysarthria.  Psychiatric:        Attention and Perception: Attention and perception normal.        Mood and Affect: Mood is depressed.        Speech: Speech normal.        Behavior: Behavior is cooperative.        Thought Content: Thought content normal. Thought content is not paranoid or delusional. Thought content does not include homicidal or suicidal ideation. Thought content does not include homicidal or suicidal plan.        Cognition and Memory: Cognition and memory normal.        Judgment: Judgment normal.     Comments: Insight intact     Lab Review:     Component Value Date/Time   NA 141 06/19/2021 1611   K 4.3 06/19/2021 1611   CL 103 06/19/2021 1611   CO2 24 06/19/2021 1611   GLUCOSE 106 (H) 06/19/2021 1611   BUN 12 06/19/2021 1611   CREATININE 0.77 06/19/2021 1611   CALCIUM 9.0 06/19/2021 1611   PROT 6.6 06/19/2021 1611   ALBUMIN 4.3 06/19/2021 1611   AST 10 06/19/2021 1611   ALT 8 06/19/2021 1611   ALKPHOS 50 06/19/2021 1611   BILITOT <0.2 06/19/2021 1611   GFRNONAA 82 07/16/2018 1324   GFRAA 95 07/16/2018 1324       Component Value Date/Time   WBC 5.7 07/16/2018 1324   RBC 4.10 07/16/2018 1324   HGB 13.1 07/16/2018 1324   HCT 38.8 07/16/2018 1324   PLT 186 07/16/2018 1324   MCV 95 07/16/2018 1324   MCH 32.0 07/16/2018 1324   MCHC 33.8 07/16/2018 1324   RDW 11.8 (L) 07/16/2018 1324   LYMPHSABS 2.0 07/16/2018 1324   EOSABS 0.2 07/16/2018 1324   BASOSABS 0.0 07/16/2018 1324    No results found for: "POCLITH", "LITHIUM"   Lab Results  Component Value Date   VALPROATE 65 06/19/2021     .res Assessment: Plan:    Pt seen for 25 minutes and time spent discussing potential benefits, risks, and side effects of Auvelity for depression. Discussed that [name] has a different mechanism of action from other medications that she has tried in the past and typically can have a more immediate effect on  depressive symptoms compared to traditional antidepressants. Pt agrees to trial of Auvelity. Will start Auvelity 45-105 mg one tablet daily for 3 days, then increase Auvelity to one tablet twice daily for depression.  Continue Subvenite 100 mg po qd for mood symptoms. Continue Fanapt 2 mg po QHS for mood, anxiety, and insomnia.  Continue Alprazolam 0.25 mg 1-2 tabs po QHS for anxiety and/or insomnia.  Continue Depakote ER 1000 mg po QHS for mood symptoms.  Pt to follow-up with this provider in 4 weeks or sooner if clinically indicated.  Patient advised to contact office with any questions, adverse effects, or acute worsening in signs and symptoms.   Starkeisha was seen today for depression.  Diagnoses  and all orders for this visit:  Episodic mood disorder (HCC) -     Dextromethorphan-buPROPion ER (AUVELITY) 45-105 MG TBCR; Take 1 tablet for 3 days, then increase to twice daily -     Dextromethorphan-buPROPion ER (AUVELITY) 45-105 MG TBCR; Take 1 tablet by mouth 2 (two) times daily.     Please see After Visit Summary for patient specific instructions.  Future Appointments  Date Time Provider Department Center  02/01/2022  2:30 PM Myna Hidalgo, DO [facility]  02/23/2022  9:30 AM Corie Chiquito, PMHNP CP-CP None  03/06/2022  4:00 PM Stevphen Meuse, [facility] CP[facility]CP None  07/17/2022  2:00 PM Corie Chiquito, PMHNP CP-CP None    No orders of the defined types were placed in this encounter.   -------------------------------

## 2022-01-31 ENCOUNTER — Telehealth: Payer: Self-pay | Admitting: Psychiatry

## 2022-01-31 NOTE — Telephone Encounter (Signed)
Next visit is 02/23/22. Vertis was started on Auvelity on last visit and is having some side effects she wants to discuss with a nurse. Her phone number is [phone].

## 2022-01-31 NOTE — Telephone Encounter (Signed)
LVM to rtc 

## 2022-02-01 ENCOUNTER — Other Ambulatory Visit (HOSPITAL_COMMUNITY)
Admission: RE | Admit: 2022-02-01 | Discharge: 2022-02-01 | Disposition: A | Discharge status: Home / Self Care | Payer: 59 | Source: Ambulatory Visit | Attending: Obstetrics & Gynecology | Admitting: Obstetrics & Gynecology

## 2022-02-01 ENCOUNTER — Encounter: Payer: Self-pay | Admitting: Obstetrics & Gynecology

## 2022-02-01 ENCOUNTER — Ambulatory Visit (INDEPENDENT_AMBULATORY_CARE_PROVIDER_SITE_OTHER): Payer: 59 | Admitting: Obstetrics & Gynecology

## 2022-02-01 VITALS — BP 105/61 | HR 69 | Ht 68.0 in | Wt 151.2 lb

## 2022-02-01 DIAGNOSIS — Z1231 Encounter for screening mammogram for malignant neoplasm of breast: Secondary | ICD-10-CM | POA: Diagnosis not present

## 2022-02-01 DIAGNOSIS — Z01419 Encounter for gynecological examination (general) (routine) without abnormal findings: Secondary | ICD-10-CM | POA: Insufficient documentation

## 2022-02-01 NOTE — Progress Notes (Signed)
   WELL-WOMAN EXAMINATION Patient name: Cindy Woods MRN [id number]  Date of birth: 12/15/1975 Chief Complaint:   Annual Exam  History of Present Illness:   Cindy Woods is a 46 y.o. G0P0000 female being seen today for a routine well-woman exam.   Today she notes no acute complaints or concerns  Menses are not as heavy, but a bit longer.  Lasting 3-4 days longer than previously.  Menses are now 7-10 days. Denies intermenstrual bleeding.  Overall, no issues with period.  Prior Pap: LSIL, HPV neg , '[]'$  repeat testing today Patient's last menstrual period was 01/23/2022. Denies issues with her menses The current method of family planning is abstinence.    Last pap. LSIL, HPV neg 01/2021 Last mammogram: 04/2021 neg. Last colonoscopy: n/a     02/01/2022    2:32 PM 01/26/2021    2:52 PM 01/13/2018    9:29 AM  Depression screen PHQ 2/9  Decreased Interest 1 0 0  Down, Depressed, Hopeless 1 1 0  PHQ - 2 Score 2 1 0  Altered sleeping 1 2 0  Tired, decreased energy 1 2 0  Change in appetite 0 1 0  Feeling bad or failure about yourself  1 1 0  Trouble concentrating 0 1 0  Moving slowly or fidgety/restless 0 0 0  Suicidal thoughts 0 0 0  PHQ-9 Score 5 8 0      Review of Systems:   Pertinent items are noted in HPI Denies any headaches, blurred vision, fatigue, shortness of breath, chest pain, abdominal pain, bowel movements, urination, or intercourse unless otherwise stated above.  Pertinent History Reviewed:  Reviewed past medical,surgical, social and family history.  Reviewed problem list, medications and allergies. Physical Assessment:   Vitals:   02/01/22 1440  BP: 105/61  Pulse: 69  Weight: 151 lb 3.2 oz (68.6 kg)  Height: '5\' 8"'$  (1.727 m)  Body mass index is 22.99 kg/m.        Physical Examination:   General appearance - well appearing, and in no distress  Mental status - alert, oriented to person, place, and time  Psych:  She has a normal mood  and affect  Skin - warm and dry, normal color, no suspicious lesions noted  Chest - effort normal, all lung fields clear to auscultation bilaterally  Heart - normal rate and regular rhythm  Neck:  midline trachea, no thyromegaly or nodules  Breasts - breasts appear normal, no suspicious masses, no skin or nipple changes or  axillary nodes  Abdomen - soft, nontender, nondistended, no masses or organomegaly  Pelvic - VULVA: normal appearing vulva with no masses, tenderness or lesions  VAGINA: normal appearing vagina with normal color and discharge, no lesions  CERVIX: normal appearing cervix without discharge or lesions, no CMT  Thin prep pap is done with HR HPV cotesting  UTERUS: uterus is felt to be normal size, shape, consistency and nontender   ADNEXA: No adnexal masses or tenderness noted.  Extremities:  No swelling or varicosities noted  Chaperone: Journalist, newspaper & Plan:  1) Well-Woman Exam -pap collected, reviewed ASCCP guidelines -mammogram ordered  Orders Placed This Encounter  Procedures   MM 3D SCREEN BREAST BILATERAL    Meds: No orders of the defined types were placed in this encounter.   Follow-up: Return for Annual, with Dr. Nelda Marseille.   Janyth Pupa, DO Attending [place], [facility] for [employer], [place]

## 2022-02-02 ENCOUNTER — Other Ambulatory Visit (HOSPITAL_COMMUNITY): Payer: Self-pay

## 2022-02-07 LAB — CYTOLOGY - PAP
Comment: NEGATIVE
High risk HPV: NEGATIVE

## 2022-02-09 ENCOUNTER — Other Ambulatory Visit (HOSPITAL_COMMUNITY): Payer: Self-pay

## 2022-02-23 ENCOUNTER — Ambulatory Visit: Payer: 59 | Admitting: Psychiatry

## 2022-03-06 ENCOUNTER — Ambulatory Visit (INDEPENDENT_AMBULATORY_CARE_PROVIDER_SITE_OTHER): Payer: 59 | Admitting: Psychiatry

## 2022-03-06 ENCOUNTER — Encounter: Payer: Self-pay | Admitting: Psychiatry

## 2022-03-06 DIAGNOSIS — F39 Unspecified mood [affective] disorder: Secondary | ICD-10-CM | POA: Diagnosis not present

## 2022-03-06 NOTE — Progress Notes (Signed)
[facility] Counselor Initial Adult Exam  Name: Cindy Woods Date: 03/06/2022 MRN: [id number] DOB: [birth year] PCP: Pcp, No  Time spent: 53 minutes start time 4:03 and time 4:56 PM   Guardian/Payee:  patient    Paperwork requested:  Yes   Reason for Visit /Presenting Problem: Patient was present for session.  She was referred by her provider [name] PMHNP.  She shared that she has been feeling stuck because she is just not happy.She shared that there are things she could be doing that could help.  She can't picture herself ever feeling happy and satisfied again. She currently doesn't have good friends.She is a Government social research officer.  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 Friends got married and moved away.  She had this shame and so she pulled away from everything. "I don't want pity but I pity myself." There is insecurity and having to start the school year is hard. "I am addicted to TV and was in a relationship for 2 years. Now I'm very lonely and depressed." Nighttime is hard with OCD due to checking her alarm clock. She has to take meds to sleep due to the issue. If anything exciting enters life just won't sleep so getting up in the morning is impossible. She stated that she requires 10 hours of sleep. She has misphonia. Mother died when patient was 23 she had an aneurysm and was in hospital for a month. Dad is difficult. She has an older sister older brother and younger sister who has 5 kids. Dad got remarried and he lives in [place] which is where she is from.  She was a music major and studied voice. Close to her teacher there and she encouraged her to get her major. She still takes voice lessons. She hasn't sang in person due to fear. Loves her voice lessons.Need to learn how to be vulnerable because she wants to  sing.Feel like a terrible person.  Patient was encouraged to think through what she would like to set for goals to be discussed at next session when treatment plan will be developed.  Mental Status Exam:    Appearance:   Well Groomed     Behavior:  Appropriate  Motor:  Normal  Speech/Language:   Normal Rate  Affect:  Appropriate and Tearful  Mood:  anxious and sad  Thought process:  normal  Thought content:    WNL  Sensory/Perceptual disturbances:    WNL  Orientation:  oriented to person, place, time/date, and situation  Attention:  Good  Concentration:  Good  Memory:  WNL  Fund of knowledge:   Good  Insight:    Good  Judgment:   Good  Impulse Control:  Good   Reported Symptoms:  sleep issues, anxiety, obsessive thinking, irritability, depression,fear, insecurity  Risk Assessment: Danger to Self:  No Self-injurious Behavior: No Danger to Others: No Duty to Warn:no Physical Aggression / Violence:No  Access to Firearms a concern: No  Gang Involvement:No  Patient / guardian was educated about steps to take if suicide or homicide risk level increases between visits: yes While future psychiatric events cannot be accurately predicted, the patient does not currently require acute inpatient psychiatric care and does not currently meet [facility] involuntary commitment criteria.  Substance Abuse History: Current substance abuse: No  Past Psychiatric History:   Previous psychological history is significant for anxiety saw [facility] Providers:PCP History of Psych Hospitalization: No  Psychological Testing:  none    Abuse History: Victim of No.,  none    Report needed: No. Victim of Neglect:No. Perpetrator of  none   Witness / Exposure to Domestic Violence: No   Protective Services Involvement: No  Witness to [employer] Violence:  No   Family History:  Family History  Problem Relation Age of Onset   Breast cancer Maternal Grandmother    Melanoma Father     Anxiety disorder Brother    Depression Sister     Living situation: the patient lives with their family  Sexual Orientation:  Straight  Relationship Status: single  Name of spouse / other:none             If a parent, number of children / ages:none  Support Systems; doesn't really have 1  Financial Stress:  No   Income/Employment/Disability: Employment  Armed forces logistics/support/administrative officer: No   Educational History: Education: college graduate  Religion/Sprituality/World View:    Christian  Any cultural differences that may affect / interfere with treatment:  not applicable   Recreation/Hobbies: voice, exercise, TV  Stressors:Health problems   Other: difficulty getting connected with others and finding a church    Strengths:  Spirituality  Barriers:  none   Legal History: Pending legal issue / charges: The patient has no significant history of legal issues. History of legal issue / charges:  none  Medical History/Surgical History:reviewed Past Medical History:  Diagnosis Date   Bipolar II disorder ([place]) 06/15/2018    History reviewed. No pertinent surgical history.  Medications: Current Outpatient Medications  Medication Sig Dispense Refill   ALPRAZolam (XANAX) 0.25 MG tablet TAKE 1 TO 2 TABLETS BY MOUTH AT  BEDTIME 60 tablet 4   Dextromethorphan-buPROPion ER (AUVELITY) 45-105 MG TBCR Take 1 tablet for 3 days, then increase to twice daily (Patient not taking: Reported on 02/01/2022) 60 tablet 0   Dextromethorphan-buPROPion ER (AUVELITY) 45-105 MG TBCR Take 1 tablet by mouth 2 (two) times daily. (Patient not taking: Reported on 02/01/2022) 60 tablet 1   divalproex (DEPAKOTE ER) 500 MG 24 hr tablet TAKE 2 TABLETS BY MOUTH ONCE  DAILY 180 tablet 3   Iloperidone (FANAPT) 2 MG TABS Take 1 tablet (2 mg total) by mouth at bedtime. 90 tablet 0   Iloperidone 2 MG TABS TAKE 1 TABLET BY MOUTH AT BEDTIME 90 tablet 1   Melatonin 1 MG CAPS Take by mouth.     SUBVENITE 100 MG tablet TAKE 1  TABLET BY MOUTH ONCE DAILY 90 tablet 1   SUBVENITE 100 MG tablet TAKE 1 TABLET BY MOUTH ONCE DAILY 90 tablet 1   No current facility-administered medications for this visit.    No Known Allergies  Diagnoses:    ICD-10-CM   1. Episodic mood disorder ([place])  F39       Plan of Care: Patient is to develop goals and set treatment plan at next session.  Patient is to take medication as directed.  Patient is to continue exercising regularly to release negative emotions appropriately.   [name], [facility]

## 2022-03-09 ENCOUNTER — Other Ambulatory Visit (HOSPITAL_COMMUNITY): Payer: Self-pay

## 2022-03-13 ENCOUNTER — Ambulatory Visit (INDEPENDENT_AMBULATORY_CARE_PROVIDER_SITE_OTHER): Payer: 59 | Admitting: Psychiatry

## 2022-03-13 DIAGNOSIS — F39 Unspecified mood [affective] disorder: Secondary | ICD-10-CM | POA: Diagnosis not present

## 2022-03-13 NOTE — Progress Notes (Signed)
      [facility] Counselor/Therapist Progress Note  Patient ID: Cindy Woods, MRN: [id number],    Date: 03/13/2022  Time Spent: 55 minutes start time 12:07 PM end time 1:02 PM  Treatment Type: Individual Therapy  Reported Symptoms: sadness, fatigue, anxiety, irritability, low motivation  Mental Status Exam:  Appearance:   Well Groomed     Behavior:  Appropriate  Motor:  Normal  Speech/Language:   Normal Rate  Affect:  Appropriate  Mood:  labile  Thought process:  normal  Thought content:    WNL  Sensory/Perceptual disturbances:    WNL  Orientation:  oriented to person, place, time/date, and situation  Attention:  Good  Concentration:  Good  Memory:  WNL  Fund of knowledge:   Good  Insight:    Good  Judgment:   Good  Impulse Control:  Good   Risk Assessment: Danger to Self:  No Self-injurious Behavior: No Danger to Others: No Duty to Warn:no Physical Aggression / Violence:No  Access to Firearms a concern: No  Gang Involvement:No   Subjective: Patient was present for session.  She shared she did go to the water park but she got upset over something there and could not let it go.  Patient shared that she struggles with being able to let go of things when she gets agitated she feels that they have to be addressed and that seems to be an issue for her and people interacting with her.  Patient went on to share that her supervisor and she had her evaluation and it was not negative but it was not positive either.  Patient stated that her supervisor said that she has at tone that is not positive and other people have an knowledge that with her as well.  Patient did processing set on supervisor telling her she has a tone, suds level 7, negative cognition "there is something wrong with me" felt confusion in her neck.  Patient was able to reduce suds level to 4.  She was able to realize that her father was very critical and she did not get much affirmation so that is a  continuous issue for her.  Discussed the importance of allowing the processing to continue over the next week.  Also discussed the importance of releasing emotions that may surface through journaling and exercise.  Agreed to try and get patient back in for an earlier session to continue processing.  Interventions: Eye Movement Desensitization and Reprocessing (EMDR) and Insight-Oriented  Diagnosis:   ICD-10-CM   1. Episodic mood disorder ([place])  F39       Plan: Patient is to work on journaling and exercising to release negative emotions appropriately between sessions.  Patient is to work on Air traffic controller.  Patient is to practice grounding exercises discussed in session.  Patient is to take medication as directed Long-term goal: Develop a consistent positive self image Short-term goal: Identify positive things about self  [name], [facility]

## 2022-03-14 ENCOUNTER — Other Ambulatory Visit (HOSPITAL_COMMUNITY): Payer: Self-pay

## 2022-03-20 ENCOUNTER — Ambulatory Visit (INDEPENDENT_AMBULATORY_CARE_PROVIDER_SITE_OTHER): Payer: 59 | Admitting: Psychiatry

## 2022-03-20 DIAGNOSIS — F39 Unspecified mood [affective] disorder: Secondary | ICD-10-CM

## 2022-03-20 NOTE — Progress Notes (Signed)
      [facility] Counselor/Therapist Progress Note  Patient ID: Cindy Woods, MRN: [id number],    Date: 03/20/2022  Time Spent: 52 minutes start time 8:10 AM end time 9:02 AM  Treatment Type: Individual Therapy  Reported Symptoms: fatigue, anxiety, sadness, irritability  Mental Status Exam:  Appearance:   Casual and Neat     Behavior:  Appropriate  Motor:  Normal  Speech/Language:   Normal Rate  Affect:  Appropriate and Tearful  Mood:  anxious and sad  Thought process:  normal  Thought content:    WNL  Sensory/Perceptual disturbances:    WNL  Orientation:  oriented to person, place, time/date, and situation  Attention:  Good  Concentration:  Good  Memory:  WNL  Fund of knowledge:   Good  Insight:    Good  Judgment:   Good  Impulse Control:  Good   Risk Assessment: Danger to Self:  No Self-injurious Behavior: No Danger to Others: No Duty to Warn:no Physical Aggression / Violence:No  Access to Firearms a concern: No  Gang Involvement:No   Subjective: Patient was present for session.   Interventions: Solution-Oriented/Positive Psychology, Eye Movement Desensitization and Reprocessing (EMDR), and Insight-Oriented  Diagnosis:   ICD-10-CM   1. Episodic mood disorder ([employer])  F39       Plan: Patient is to work on journaling and exercising to release negative emotions appropriately between sessions.  Patient is to work on Air traffic controller.  Patient is to practice grounding exercises discussed in session.  Patient is to take medication as directed Long-term goal: Develop a consistent positive self image Short-term goal: Identify positive things about self  [name], [facility]

## 2022-04-02 ENCOUNTER — Ambulatory Visit
Admission: RE | Admit: 2022-04-02 | Discharge: 2022-04-02 | Disposition: A | Discharge status: Home / Self Care | Payer: 59 | Source: Ambulatory Visit | Attending: Obstetrics & Gynecology | Admitting: Obstetrics & Gynecology

## 2022-04-02 DIAGNOSIS — Z1231 Encounter for screening mammogram for malignant neoplasm of breast: Secondary | ICD-10-CM

## 2022-04-04 ENCOUNTER — Ambulatory Visit (INDEPENDENT_AMBULATORY_CARE_PROVIDER_SITE_OTHER): Payer: 59 | Admitting: Psychiatry

## 2022-04-04 DIAGNOSIS — F39 Unspecified mood [affective] disorder: Secondary | ICD-10-CM

## 2022-04-04 NOTE — Progress Notes (Signed)
      [facility] Counselor/Therapist Progress Note  Patient ID: Cindy Woods, MRN: [id number],    Date: 04/04/2022  Time Spent: 58 minutes start time 4:06 PM end time 5:04 PM  Treatment Type: Individual Therapy  Reported Symptoms: anxiety, sadness, irritability, triggered responses, anger, rumination  Mental Status Exam:  Appearance:   Well Groomed     Behavior:  Appropriate  Motor:  Normal  Speech/Language:   Normal Rate  Affect:  Appropriate  Mood:  anxious and sad  Thought process:  normal  Thought content:    WNL  Sensory/Perceptual disturbances:    WNL  Orientation:  oriented to person, place, time/date, and situation  Attention:  Good  Concentration:  Good  Memory:  WNL  Fund of knowledge:   Good  Insight:    Good  Judgment:   Good  Impulse Control:  Good   Risk Assessment: Danger to Self:  No Self-injurious Behavior: No Danger to Others: No Duty to Warn:no Physical Aggression / Violence:No  Access to Firearms a concern: No  Gang Involvement:No   Subjective: Patient was present for session. She shared she is struggling due to not enjoying her job. She shared she has been irritable at work. Patient shared that she is not happy with her situation but feels stuck and cannot move in any direction.  Patient was allowed time to share the things that have been coming up.  Taught patient different CBT skills including thoughts/feelings versus facts/truth.  Patient reported struggling with recognizing the facts are the truth.  Discussed how her negative thoughts continue to spiral her into a depression state.  Gave patient handouts on changing faulty beliefs through different CBT strategies.  Discussed the handouts with clinician and the importance of recognizing what she needs to focus on rather than ruminating on all the negative.  Patient was able to recognize that is a pattern for her and it never ends well but she has difficulty changing her thoughts.  Encouraged  her to start practicing different skills discussed in session and focusing on her own self-care to move in a different direction.  Patient was able to develop some plans and agreed to try.  Patient was also encouraged to try and reframe her thoughts and recognize the things that she is doing well and she is thankful for especially when she feels the rumination starting.  Interventions: Cognitive Behavioral Therapy, Solution-Oriented/Positive Psychology, and Insight-Oriented  Diagnosis:   ICD-10-CM   1. Episodic mood disorder ([place])  F39       Plan: Patient is to utilize CBT and coping skills.  Patient is to follow through on using handouts given to her in session.  Patient is to remind herself of the things that she is thankful for and the positive.  Patient is to work on affirming herself regularly and focusing on the things that she can control fix and change.  Patient was encouraged to remind herself to take things 1 step at a time and acknowledged the things that she has done positively.  Patient is to work on journaling and exercising to release negative emotions appropriately between sessions.  Patient is to work on Air traffic controller.  Patient is to practice grounding exercises discussed in session.  Patient is to take medication as directed Long-term goal: Develop a consistent positive self image Short-term goal: Identify positive things about self  [name], [facility]

## 2022-04-12 ENCOUNTER — Other Ambulatory Visit (HOSPITAL_COMMUNITY): Payer: Self-pay

## 2022-04-13 ENCOUNTER — Other Ambulatory Visit (HOSPITAL_COMMUNITY): Payer: Self-pay

## 2022-04-16 NOTE — Progress Notes (Deleted)
Referring-Ryan Vanessa [place], DO Reason for referral-palpitations  HPI: 46 year old female for evaluation of palpitations at request of Lurline Del, DO.  Laboratories March 22, 2022 showed hemoglobin 11.1, potassium 4.6 and minimally elevated TSH.  Current Outpatient Medications  Medication Sig Dispense Refill   ALPRAZolam (XANAX) 0.25 MG tablet TAKE 1 TO 2 TABLETS BY MOUTH AT  BEDTIME 60 tablet 4   Dextromethorphan-buPROPion ER (AUVELITY) 45-105 MG TBCR Take 1 tablet for 3 days, then increase to twice daily (Patient not taking: Reported on 02/01/2022) 60 tablet 0   Dextromethorphan-buPROPion ER (AUVELITY) 45-105 MG TBCR Take 1 tablet by mouth 2 (two) times daily. (Patient not taking: Reported on 02/01/2022) 60 tablet 1   divalproex (DEPAKOTE ER) 500 MG 24 hr tablet TAKE 2 TABLETS BY MOUTH ONCE  DAILY 180 tablet 3   Iloperidone (FANAPT) 2 MG TABS Take 1 tablet (2 mg total) by mouth at bedtime. 90 tablet 0   Iloperidone 2 MG TABS TAKE 1 TABLET BY MOUTH AT BEDTIME 90 tablet 1   Melatonin 1 MG CAPS Take by mouth.     SUBVENITE 100 MG tablet TAKE 1 TABLET BY MOUTH ONCE DAILY 90 tablet 1   SUBVENITE 100 MG tablet TAKE 1 TABLET BY MOUTH ONCE DAILY 90 tablet 1   No current facility-administered medications for this visit.    No Known Allergies   Past Medical History:  Diagnosis Date   Bipolar II disorder ([place]) 06/15/2018    No past surgical history on file.  Social History   Socioeconomic History   Marital status: Single    Spouse name: Not on file   Number of children: Not on file   Years of education: Not on file   Highest education level: Not on file  Occupational History   Not on file  Tobacco Use   Smoking status: Never   Smokeless tobacco: Never  Vaping Use   Vaping Use: Never used  Substance and Sexual Activity   Alcohol use: Yes    Comment: Rare- "once a month"   Drug use: Never   Sexual activity: Not Currently    Birth control/protection: None  Other Topics  Concern   Not on file  Social History Narrative   Not on file   Social Determinants of Health   Financial Resource Strain: Low Risk  (02/01/2022)   Overall Financial Resource Strain (CARDIA)    Difficulty of Paying Living Expenses: Not hard at all  Food Insecurity: No Food Insecurity (02/01/2022)   Hunger Vital Sign    Worried About Running Out of Food in the Last Year: Never true    [place] in the Last Year: Never true  Transportation Needs: No Transportation Needs (02/01/2022)   PRAPARE - Hydrologist (Medical): No    Lack of Transportation (Non-Medical): No  Physical Activity: Insufficiently Active (02/01/2022)   Exercise Vital Sign    Days of Exercise per Week: 4 days    Minutes of Exercise per Session: 30 min  Stress: Stress Concern Present (02/01/2022)   [place]    Feeling of Stress : To some extent  Social Connections: Moderately Integrated (02/01/2022)   Social Connection and Isolation Panel [NHANES]    Frequency of Communication with Friends and Family: Three times a week    Frequency of Social Gatherings with Friends and Family: Once a week    Attends Religious Services: More than 4 times per  year    Active Member of Clubs or Organizations: No    Attends Archivist Meetings: More than 4 times per year    Marital Status: Never married  Intimate Partner Violence: Not At Risk (02/01/2022)   Humiliation, Afraid, Rape, and Kick questionnaire    Fear of Current or Ex-Partner: No    Emotionally Abused: No    Physically Abused: No    Sexually Abused: No    Family History  Problem Relation Age of Onset   Breast cancer Maternal Grandmother    Melanoma Father    Anxiety disorder Brother    Depression Sister     ROS: no fevers or chills, productive cough, hemoptysis, dysphasia, odynophagia, melena, hematochezia, dysuria, hematuria, rash, seizure activity,  orthopnea, PND, pedal edema, claudication. Remaining systems are negative.  Physical Exam:   There were no vitals taken for this visit.  General:  Well developed/well nourished in NAD Skin warm/dry Patient not depressed No peripheral clubbing Back-normal HEENT-normal/normal eyelids Neck supple/normal carotid upstroke bilaterally; no bruits; no JVD; no thyromegaly chest - CTA/ normal expansion CV - RRR/normal S1 and S2; no murmurs, rubs or gallops;  PMI nondisplaced Abdomen -NT/ND, no HSM, no mass, + bowel sounds, no bruit 2+ femoral pulses, no bruits Ext-no edema, chords, 2+ DP Neuro-grossly nonfocal  ECG - personally reviewed  A/P  1 palpitations-  Kirk Ruths, MD

## 2022-04-17 ENCOUNTER — Ambulatory Visit (INDEPENDENT_AMBULATORY_CARE_PROVIDER_SITE_OTHER): Payer: 59 | Admitting: Psychiatry

## 2022-04-17 DIAGNOSIS — F39 Unspecified mood [affective] disorder: Secondary | ICD-10-CM

## 2022-04-17 NOTE — Progress Notes (Signed)
[facility] Counselor/Therapist Progress Note  Patient ID: Cindy Woods, MRN: [id number],    Date: 04/17/2022  Time Spent: 55 minutes start time 4:08 PM end time 5:03 PM  Treatment Type: Individual Therapy  Reported Symptoms: anxiety, agitation, sadness, irritability  Mental Status Exam:  Appearance:   Well Groomed     Behavior:  Appropriate  Motor:  Normal  Speech/Language:   Normal Rate  Affect:  Appropriate  Mood:  anxious  Thought process:  normal  Thought content:    WNL  Sensory/Perceptual disturbances:    WNL  Orientation:  oriented to person, place, time/date, and situation  Attention:  Good  Concentration:  Good  Memory:  WNL  Fund of knowledge:   Good  Insight:    Good  Judgment:   Good  Impulse Control:  Good   Risk Assessment: Danger to Self:  No Self-injurious Behavior: No Danger to Others: No Duty to Warn:no Physical Aggression / Violence:No  Access to Firearms a concern: No  Gang Involvement:No   Subjective: Patient was worked in for an appointment. 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  Patient went on to share that she has had multiple difficult conversations with parents and was feeling an extreme amount of pressure at her work.  Patient shared some of the different situations she was having to deal with including heart issues and diabetic children who are managing it appropriately and someone with Crohn's.  Patient shared that having so many students that are difficult and only being at that school twice a week is getting to be overwhelming.  Did processing set on the high needed kids, suds level 9, negative cognition "I cannot handle this" felt annoyed and angry in her chest.  Patient was able to reduce suds level to 3.  She was able to recognize that she is doing what she can do and there may be some lapses in different times due to having to handle the crisis that is  most pertinent at the moment.  Patient was encouraged to talk herself through dealing with the parents that were getting upset if she did not make multiple phone calls or handle things the way they think she should because of having to deal with other crisis that more life threatening at the moment.  Was encouraged to try and maintain perspective and use visuals from session to help her.  Interventions: Cognitive Behavioral Therapy, Eye Movement Desensitization and Reprocessing (EMDR), and Insight-Oriented  Diagnosis:   ICD-10-CM   1. Episodic mood disorder ([place])  F39       Plan: Patient is to utilize CBT and coping skills.  Patient is to work on Furniture conservator/restorer and visuals discussed in session.  Patient is to follow through on using handouts given to her in previous session.  Patient is to remind herself of the things that she is thankful for and the positive.  Patient is to work on affirming herself regularly and focusing on the things that she can control fix and change.  Patient was encouraged to remind herself to take things 1 step at a time and acknowledged the things that she has done positively.  Patient is to work on journaling and exercising to release negative emotions appropriately between sessions.  Patient is to work on Air traffic controller.  Patient is to practice grounding exercises discussed in session.  Patient is to take medication as directed Long-term  goal: Develop a consistent positive self image Short-term goal: Identify positive things about self  [name], [facility]

## 2022-04-24 ENCOUNTER — Ambulatory Visit: Payer: 59 | Admitting: Cardiology

## 2022-05-02 ENCOUNTER — Ambulatory Visit: Payer: 59 | Attending: Internal Medicine

## 2022-05-02 ENCOUNTER — Ambulatory Visit: Payer: 59 | Attending: Internal Medicine | Admitting: Internal Medicine

## 2022-05-02 ENCOUNTER — Encounter: Payer: Self-pay | Admitting: Internal Medicine

## 2022-05-02 VITALS — BP 94/60 | HR 62 | Ht 68.0 in | Wt 153.0 lb

## 2022-05-02 DIAGNOSIS — R002 Palpitations: Secondary | ICD-10-CM

## 2022-05-02 DIAGNOSIS — R072 Precordial pain: Secondary | ICD-10-CM | POA: Diagnosis not present

## 2022-05-02 DIAGNOSIS — F319 Bipolar disorder, unspecified: Secondary | ICD-10-CM | POA: Diagnosis not present

## 2022-05-02 DIAGNOSIS — R0600 Dyspnea, unspecified: Secondary | ICD-10-CM

## 2022-05-02 NOTE — Patient Instructions (Signed)
Medication Instructions:  NO CHANGES *If you need a refill on your cardiac medications before your next appointment, please call your pharmacy*   Lab Work: NONE If you have labs (blood work) drawn today and your tests are completely normal, you will receive your results only by: [place] (if you have MyChart) OR A paper copy in the mail If you have any lab test that is abnormal or we need to change your treatment, we will call you to review the results.   Testing/Procedures: Your physician has requested that you have an echocardiogram. Echocardiography is a painless test that uses sound waves to create images of your heart. It provides your doctor with information about the size and shape of your heart and how well your heart's chambers and valves are working. This procedure takes approximately one hour. There are no restrictions for this procedure.  Your physician has requested that you have an exercise tolerance test. For further information please visit [email or web address]. Please also follow instruction sheet, as given.  3 Day Zio Monitor - see details below   Follow-Up: As needed  Other Instructions ZIO XT- Long Term Monitor Instructions  Your physician has requested you wear a ZIO patch monitor for 3 days.  This is a single patch monitor. [employer] supplies one patch monitor per enrollment. Additional stickers are not available. Please do not apply patch if you will be having a Nuclear Stress Test,  Echocardiogram, Cardiac CT, MRI, or Chest Xray during the period you would be wearing the  monitor. The patch cannot be worn during these tests. You cannot remove and re-apply the  ZIO XT patch monitor.  Your ZIO patch monitor will be mailed 3 day USPS to your address on file. It may take 3-5 days  to receive your monitor after you have been enrolled.  Once you have received your monitor, please review the enclosed instructions. Your monitor  has already been registered  assigning a specific monitor serial # to you.  Billing and Patient Assistance Program Information  We have supplied [employer] with any of your insurance information on file for billing purposes. [employer] offers a sliding scale Patient Assistance Program for patients that do not have  insurance, or whose insurance does not completely cover the cost of the ZIO monitor.  You must apply for the Patient Assistance Program to qualify for this discounted rate.  To apply, please call [employer] at [phone], select option 4, select option 2, ask to apply for  Patient Assistance Program. Theodore Demark will ask your household income, and how many people  are in your household. They will quote your out-of-pocket cost based on that information.  [employer] will also be able to set up a 28-month interest-free payment plan if needed.  Applying the monitor Hold abrader disc by orange tab. Rub abrader in 40 strokes over the upper left chest as  indicated in your monitor instructions.  Clean area with 4 enclosed alcohol pads. Let dry.  Apply patch as indicated in monitor instructions. Patch will be placed under collarbone on left  side of chest with arrow pointing upward.  Rub patch adhesive wings for 2 minutes. Remove white label marked "1". Remove the white  label marked "2". Rub patch adhesive wings for 2 additional minutes.  While looking in a mirror, press and release button in center of patch. A small green light will  flash 3-4 times. This will be your only indicator that the monitor has been turned on.  Do not shower for  the first 24 hours. You may shower after the first 24 hours.  Press the button if you feel a symptom. You will hear a small click. Record Date, Time and  Symptom in the Patient Logbook.  When you are ready to remove the patch, follow instructions on the last 2 pages of Patient  Logbook. Stick patch monitor onto the last page of Patient Logbook.  Place Patient Logbook in the blue and white  box. Use locking tab on box and tape box closed  securely. The blue and white box has prepaid postage on it. Please place it in the mailbox as  soon as possible. Your physician should have your test results approximately 7 days after the  monitor has been mailed back to [facility].  Call [facility] at [phone] if you have questions regarding  your ZIO XT patch monitor. Call them immediately if you see an orange light blinking on your  monitor.  If your monitor falls off in less than 4 days, contact our Monitor department at [phone].  If your monitor becomes loose or falls off after 4 days call [employer] at [phone] for  suggestions on securing your monitor

## 2022-05-02 NOTE — Progress Notes (Unsigned)
Enrolled for [employer] to mail a ZIO XT long term holter monitor to the patients address on file.  

## 2022-05-02 NOTE — Progress Notes (Addendum)
Cardiology Office Note:    Date:  05/02/2022   ID:  Cindy Woods, DOB 02/04/1976, MRN [id number]  PCP:  Cindy Woods   [facility] Providers Cardiologist:  Cindy Sciara, MD Referring MD: Cindy Del, DO   Chief Complaint/Reason for Referral: Tachycardia  ASSESSMENT:    1. Palpitations   2. Bipolar affective disorder, remission status unspecified ([name])   3. Precordial pain   4. Dyspnea, unspecified type     PLAN:    In order of problems listed above: 1.  Palpitations: We will obtain echocardiogram and monitor to evaluate further. 2.  Bipolar disorder: This is being followed by her primary care provider. 3.  Chest pain: We will obtain an exercise treadmill stress test to evaluate further. 4.  Dyspnea: We will obtain echocardiogram to evaluate further.  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  Recommended starting beta blocker.  Will start Toprol XL '25mg'$  QPM.       Shared Decision Making/Informed Consent The risks [chest pain, shortness of breath, cardiac arrhythmias, dizziness, blood pressure fluctuations, myocardial infarction, stroke/transient ischemic attack, and life-threatening complications (estimated to be 1 in 10,000)], benefits (risk stratification, diagnosing coronary artery disease, treatment guidance) and alternatives of an exercise tolerance test were discussed in detail with Cindy Woods and she agrees to proceed.   Dispo:  Return if symptoms worsen or fail to improve.      Medication Adjustments/Labs and Tests Ordered: Current medicines are reviewed at length with the patient today.  Concerns regarding medicines are outlined above.  The following changes have been made:  no change   Labs/tests ordered: Orders Placed This Encounter  Procedures   LONG TERM MONITOR (3-14 DAYS)   Exercise Tolerance Test   EKG 12-Lead   ECHOCARDIOGRAM COMPLETE    Medication Changes: No orders of the defined types  were placed in this encounter.    Current medicines are reviewed at length with the patient today.  The patient does not have concerns regarding medicines.   History of Present Illness:    FOCUSED PROBLEM LIST:   1.  Bipolar disorder  The patient is a 46 y.o. female with the indicated medical history here for for recommendations regarding a history of tachycardia.  Patient was seen by her primary care provider recently.  Interestingly her heart rate was 70 at the time.  She is referred for evaluation of history of tachycardia labs were drawn which demonstrated a hemoglobin of 11.1 normal creatinine of 0.78 and LFTs and a mildly elevated TSH of 4.72.  The patient tells me that over the last several years she noticed increasing shortness of breath.  She used to work out 4 times a week and now only works out about 2 times a week and notices she gets quite short of breath just may be walking to her car or upstairs.  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  She did have an episode of chest discomfort associate with palpitations rather recently.  This is in the context of her being diagnosed with mild anemia and being started on iron pills.  She fortunately has not required any emergency room visits or hospitalizations.  She is otherwise well without complaints.  She does not smoke.       Current Medications: Current Meds  Medication Sig   ALPRAZolam (XANAX) 0.25 MG tablet TAKE 1 TO 2 TABLETS BY MOUTH AT  BEDTIME   divalproex (DEPAKOTE ER) 500 MG 24  hr tablet TAKE 2 TABLETS BY MOUTH ONCE  DAILY   Iloperidone 2 MG TABS TAKE 1 TABLET BY MOUTH AT BEDTIME   Melatonin 1 MG CAPS Take by mouth.   SUBVENITE 100 MG tablet TAKE 1 TABLET BY MOUTH ONCE DAILY     Allergies:    Patient has no known allergies.   Social History:   Social History   Tobacco Use   Smoking status: Never   Smokeless tobacco: Never  Vaping Use   Vaping Use: Never used  Substance Use Topics   Alcohol  use: Yes    Comment: Rare- "once a month"   Drug use: Never     Family Hx: Family History  Problem Relation Age of Onset   Breast cancer Maternal Grandmother    Melanoma Father    Anxiety disorder Brother    Depression Sister      Review of Systems:   Please see the history of present illness.    All other systems reviewed and are negative.     EKGs/Labs/Other Test Reviewed:    EKG:  EKG performed today that I personally reviewed demonstrates normal sinus rhythm  Prior CV studies:  None available  Other studies Reviewed: Review of the additional studies/records demonstrates: None available  Recent Labs: 06/19/2021: ALT 8; BUN 12; Creatinine, Ser 0.77; Potassium 4.3; Sodium 141   Recent Lipid Panel No results found for: "CHOL", "TRIG", "HDL", "LDLCALC", "LDLDIRECT"  Risk Assessment/Calculations:                Physical Exam:    VS:  BP 94/60 (BP Location: Left Arm, Patient Position: Sitting, Cuff Size: Normal)   Pulse 62   Ht '5\' 8"'$  (1.727 m)   Wt 153 lb (69.4 kg)   SpO2 98%   BMI 23.26 kg/m    Wt Readings from Last 3 Encounters:  05/02/22 153 lb (69.4 kg)  02/01/22 151 lb 3.2 oz (68.6 kg)  02/14/21 150 lb (68 kg)    GENERAL:  No apparent distress, AOx3 HEENT:  No carotid bruits, +2 carotid impulses, no scleral icterus CAR: RRR no murmurs, gallops, rubs, or thrills RES:  Clear to auscultation bilaterally ABD:  Soft, nontender, nondistended, positive bowel sounds x 4 VASC:  +2 radial pulses, +2 carotid pulses, palpable pedal pulses NEURO:  CN 2-12 grossly intact; motor and sensory grossly intact PSYCH:  No active depression or anxiety EXT:  No edema, ecchymosis, or cyanosis  Signed, Cindy Osmond, MD  05/02/2022 2:57 PM    [facility] [address], [place], [place]  [address] Phone: [phone]; Fax: [phone]   Note:  This document was prepared using Dragon voice recognition software and may include unintentional  dictation errors.

## 2022-05-03 ENCOUNTER — Other Ambulatory Visit: Payer: Self-pay | Admitting: Internal Medicine

## 2022-05-03 DIAGNOSIS — R072 Precordial pain: Secondary | ICD-10-CM

## 2022-05-04 DIAGNOSIS — R072 Precordial pain: Secondary | ICD-10-CM

## 2022-05-04 DIAGNOSIS — R0600 Dyspnea, unspecified: Secondary | ICD-10-CM

## 2022-05-04 DIAGNOSIS — R002 Palpitations: Secondary | ICD-10-CM

## 2022-05-09 ENCOUNTER — Ambulatory Visit (INDEPENDENT_AMBULATORY_CARE_PROVIDER_SITE_OTHER): Payer: 59

## 2022-05-09 ENCOUNTER — Ambulatory Visit: Payer: 59 | Attending: Internal Medicine

## 2022-05-09 DIAGNOSIS — R0609 Other forms of dyspnea: Secondary | ICD-10-CM

## 2022-05-09 DIAGNOSIS — R002 Palpitations: Secondary | ICD-10-CM | POA: Insufficient documentation

## 2022-05-09 DIAGNOSIS — R072 Precordial pain: Secondary | ICD-10-CM | POA: Diagnosis not present

## 2022-05-09 DIAGNOSIS — R0600 Dyspnea, unspecified: Secondary | ICD-10-CM | POA: Diagnosis present

## 2022-05-09 LAB — EXERCISE TOLERANCE TEST
Angina Index: 0
Duke Treadmill Score: 12
Estimated workload: 13.4
Exercise duration (min): 12 min
Exercise duration (sec): 0 s
MPHR: 177 {beats}/min
Peak HR: 169 {beats}/min
Percent HR: 95 %
Rest HR: 67 {beats}/min
ST Depression (mm): 0 mm

## 2022-05-10 ENCOUNTER — Ambulatory Visit: Payer: 59 | Admitting: Internal Medicine

## 2022-05-11 ENCOUNTER — Other Ambulatory Visit (HOSPITAL_COMMUNITY): Payer: Self-pay

## 2022-05-11 LAB — ECHOCARDIOGRAM COMPLETE
Area-P 1/2: 5.02 cm2
S' Lateral: 2.5 cm

## 2022-05-15 ENCOUNTER — Telehealth: Payer: Self-pay | Admitting: *Deleted

## 2022-05-15 ENCOUNTER — Other Ambulatory Visit (HOSPITAL_COMMUNITY): Payer: Self-pay

## 2022-05-15 MED ORDER — METOPROLOL SUCCINATE ER 25 MG PO TB24
25.0000 mg | ORAL_TABLET | Freq: Every day | ORAL | 1 refills | Status: DC
  Filled 2022-05-15: qty 30, 30d supply, fill #0

## 2022-05-15 NOTE — Telephone Encounter (Signed)
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  Lets have her see [place] in 1 month, thanks.

## 2022-05-15 NOTE — Telephone Encounter (Signed)
I called the patient to inform of review of monitor and recommendations.  She is happy to report that she has not felt a single PVC in the past several days. She will pick up the medication but prefers not to start it unless the PVCs start up again.  Adv to take at bedtime. If she starts them, she may start w half tab daily.  I wanted to make her 1 mo follow up but patient states she needs to call back as she is driving.

## 2022-05-17 ENCOUNTER — Ambulatory Visit (INDEPENDENT_AMBULATORY_CARE_PROVIDER_SITE_OTHER): Payer: 59 | Admitting: Psychiatry

## 2022-05-17 DIAGNOSIS — F39 Unspecified mood [affective] disorder: Secondary | ICD-10-CM

## 2022-05-17 NOTE — Progress Notes (Unsigned)
      [facility] Counselor/Therapist Progress Note  Patient ID: Cindy Woods, MRN: [id number],    Date: 05/17/2022  Time Spent: 50 minutes start time 3:03 PM end 3:53 PM  Treatment Type: Individual Therapy  Reported Symptoms: anxiety, frustration, obsessive thinking, sadness, low motivation  Mental Status Exam:  Appearance:   Well Groomed     Behavior:  Appropriate  Motor:  Normal  Speech/Language:   Normal Rate  Affect:  Appropriate  Mood:  irritable  Thought process:  normal  Thought content:    WNL  Sensory/Perceptual disturbances:    WNL  Orientation:  oriented to person, place, time/date, and situation  Attention:  Good  Concentration:  Good  Memory:  WNL  Fund of knowledge:   Good  Insight:    Good  Judgment:   Good  Impulse Control:  Good   Risk Assessment: Danger to Self:  No Self-injurious Behavior: No Danger to Others: No Duty to Warn:no Physical Aggression / Violence:No  Access to Firearms a concern: No  Gang Involvement:No   Subjective: Patient was present for session. She reported that she has been irritatedall day.   Interventions: Solution-Oriented/Positive Psychology, Eye Movement Desensitization and Reprocessing (EMDR), and Insight-Oriented  Diagnosis:   ICD-10-CM   1. Episodic mood disorder ([place])  F39       Plan:  Patient is to utilize CBT and coping skills.  Patient is to work on Furniture conservator/restorer and visuals discussed in session.  Patient is to follow through on using handouts given to her in previous session.  Patient is to remind herself of the things that she is thankful for and the positive.  Patient is to work on affirming herself regularly and focusing on the things that she can control fix and change.  Patient was encouraged to remind herself to take things 1 step at a time and acknowledged the things that she has done positively.  Patient is to work on journaling and exercising to release negative emotions appropriately between  sessions.  Patient is to work on Air traffic controller.  Patient is to practice grounding exercises discussed in session.  Patient is to take medication as directed Long-term goal: Develop a consistent positive self image Short-term goal: Identify positive things about self  Cindy Woods, [facility]

## 2022-05-22 ENCOUNTER — Ambulatory Visit (INDEPENDENT_AMBULATORY_CARE_PROVIDER_SITE_OTHER): Payer: 59 | Admitting: Psychiatry

## 2022-05-22 DIAGNOSIS — F39 Unspecified mood [affective] disorder: Secondary | ICD-10-CM | POA: Diagnosis not present

## 2022-05-22 NOTE — Progress Notes (Signed)
      [facility] Counselor/Therapist Progress Note  Patient ID: Cindy Woods, MRN: [id number],    Date: 05/22/2022  Time Spent: 50 minutes start time 4:04 PM end time 4:54 PM  Treatment Type: Individual Therapy  Reported Symptoms: sadness, anxiety, melt downs, fatigue, rumination, irritability  Mental Status Exam:  Appearance:   Well Groomed     Behavior:  Resistant  Motor:  Normal  Speech/Language:   Normal Rate  Affect:  Appropriate  Mood:  irritable  Thought process:  normal  Thought content:    WNL  Sensory/Perceptual disturbances:    WNL  Orientation:  oriented to person, place, time/date, and situation  Attention:  Good  Concentration:  Good  Memory:  WNL  Fund of knowledge:   Good  Insight:    Good  Judgment:   Good  Impulse Control:  Good   Risk Assessment: Danger to Self:  No Self-injurious Behavior: No Danger to Others: No Duty to Warn:no Physical Aggression / Violence:No  Access to Firearms a concern: No  Gang Involvement:No   Subjective: Patient was present for session.  She shared she was able to have a few positive days since last session.  She shared she is noticing she is still struggling with feeling that she is not who she wants to be.  She feels she needs very irritable still with others.  She has started trying to connect with people on line and is finding herself to be very critical.  Allowed patient time to share what she was noticing and discussed ways to think through those negative situations.  Patient was encouraged to use her CBT filters to help deal with the self-talk in her head.  Patient was also encouraged to focus on things that have worked out when she did not think they would.    Interventions: Cognitive Behavioral Therapy and Solution-Oriented/Positive Psychology  Diagnosis:   ICD-10-CM   1. Episodic mood disorder ([place])  F39       Plan:  Patient is to utilize CBT and coping skills.  Patient is to work on journaling the  processing that takes place through the week and to remind herself of the ways God is provided for her.  Patient is to remind herself of the things that she is thankful for and the positive.  Patient is to work on affirming herself regularly and focusing on the things that she can control fix and change.  Patient was encouraged to remind herself to take things 1 step at a time and acknowledged the things that she has done positively.  Patient is to work on journaling and exercising to release negative emotions appropriately between sessions.  Patient is to work on Air traffic controller.  Patient is to practice grounding exercises discussed in session.  Patient is to take medication as directed Long-term goal: Develop a consistent positive self image Short-term goal: Identify positive things about self  [name], [facility]

## 2022-05-26 ENCOUNTER — Other Ambulatory Visit (HOSPITAL_COMMUNITY): Payer: Self-pay

## 2022-06-05 ENCOUNTER — Ambulatory Visit (INDEPENDENT_AMBULATORY_CARE_PROVIDER_SITE_OTHER): Payer: 59 | Admitting: Psychiatry

## 2022-06-05 DIAGNOSIS — F39 Unspecified mood [affective] disorder: Secondary | ICD-10-CM | POA: Diagnosis not present

## 2022-06-05 NOTE — Progress Notes (Signed)
[facility] Counselor/Therapist Progress Note  Patient ID: Cindy Woods, MRN: [id number],    Date: 06/05/2022  Time Spent: 50 minutes start time 3:10 PM end time 4 PM  Treatment Type: Individual Therapy  Reported Symptoms: anxiety, sadness, irritability, rumination, obsessive thinking, manic behavior, sleep issues  Mental Status Exam:  Appearance:   Casual and Neat     Behavior:  Appropriate  Motor:  Normal  Speech/Language:   Normal Rate  Affect:  Appropriate  Mood:  sad  Thought process:  normal  Thought content:    WNL  Sensory/Perceptual disturbances:    WNL  Orientation:  oriented to person, place, time/date, and situation  Attention:  Good  Concentration:  Good  Memory:  WNL  Fund of knowledge:   Good  Insight:    Good  Judgment:   Good  Impulse Control:  Good   Risk Assessment: Danger to Self:  No Self-injurious Behavior: No Danger to Others: No Duty to Warn:no Physical Aggression / Violence:No  Access to Firearms a concern: No  Gang Involvement:No   Subjective: Patient was present for session. She shared that work is going okay. 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  She went on to share that she is distracted by a guy and she knows that it is not good for her.  Patient reported she knows that being excited is starting to give her some manic feelings and she was not able to go to sleep last night.  Patient discussed the fact that she goes back and forth on the relationship and whether it is healthy or not healthy.  Patient was encouraged to think through what she wants in the relationship and what is important to her and to look at his choices and behaviors to help her make a decision on what is the best option.  Ways for patient to try and stay grounded and focused on the things that she has to do were discussed with patient.  She was encouraged to try and take things in small steps.  She was able to acknowledge how difficult that was for  her.  Discussed different CBT skills that she could try as well as grounding exercises to help her get through the.  Where she is feeling excited about the relationship that she is not even sure she wants her needs.  Interventions: Cognitive Behavioral Therapy and Solution-Oriented/Positive Psychology  Diagnosis:   ICD-10-CM   1. Episodic mood disorder ([place])  F39       Plan:  Patient is to utilize CBT and coping skills.  Patient is to work on journaling the processing that takes place through the week and to remind herself of the ways God is provided for her.  Patient is to remind herself of the things that she is thankful for and the positive.  Patient is to work on affirming herself regularly and focusing on the things that she can control fix and change.  Patient was encouraged to remind herself to take things 1 step at a time and acknowledged the things that she has done positively.  Patient is to work on journaling and exercising to release negative emotions appropriately between sessions.  Patient is to work on Air traffic controller.  Patient is to practice grounding exercises discussed in session.  Patient is to take medication as directed Long-term goal: Develop a consistent positive self image Short-term goal: Identify positive things about self  [name], [facility]

## 2022-06-11 ENCOUNTER — Other Ambulatory Visit (HOSPITAL_COMMUNITY): Payer: Self-pay

## 2022-06-12 ENCOUNTER — Other Ambulatory Visit (HOSPITAL_COMMUNITY): Payer: Self-pay

## 2022-06-13 ENCOUNTER — Ambulatory Visit: Payer: 59 | Admitting: Psychiatry

## 2022-06-21 ENCOUNTER — Ambulatory Visit: Payer: 59 | Admitting: Obstetrics & Gynecology

## 2022-06-25 ENCOUNTER — Ambulatory Visit: Payer: Self-pay | Admitting: Obstetrics & Gynecology

## 2022-06-25 DIAGNOSIS — Z539 Procedure and treatment not carried out, unspecified reason: Secondary | ICD-10-CM

## 2022-06-27 ENCOUNTER — Ambulatory Visit (INDEPENDENT_AMBULATORY_CARE_PROVIDER_SITE_OTHER): Payer: 59 | Admitting: Psychiatry

## 2022-06-27 DIAGNOSIS — F39 Unspecified mood [affective] disorder: Secondary | ICD-10-CM

## 2022-06-27 NOTE — Progress Notes (Signed)
[facility] Counselor/Therapist Progress Note  Patient ID: Cindy Woods, MRN: [id number],    Date: 06/27/2022  Time Spent: 50 minutes start time 1:01 PM end time 1:51 PM  Treatment Type: Individual Therapy  Reported Symptoms: anxiety, sadness, irritability, fatigue, anger  Mental Status Exam:  Appearance:   Well Groomed     Behavior:  Appropriate  Motor:  Normal  Speech/Language:   Normal Rate  Affect:  Appropriate  Mood:  irritable  Thought process:  normal  Thought content:    WNL  Sensory/Perceptual disturbances:    WNL  Orientation:  oriented to person, place, time/date, and situation  Attention:  Good  Concentration:  Good  Memory:  WNL  Fund of knowledge:   Good  Insight:    Good  Judgment:   Good  Impulse Control:  Good   Risk Assessment: Danger to Self:  No Self-injurious Behavior: No Danger to Others: No Duty to Warn:no Physical Aggression / Violence:No  Access to Firearms a concern: No  Gang Involvement:No   Subjective: Patient was present for session. 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  She shared she wasn't feeling depressed or anxious due to just disconnecting from things. She shared that she is still miserable at her job and she feels like it is ruining her life.  Patient was encouraged to think through what exactly was going on.  She shared that she is frustrated because she does not like her job but does like the flexibility and relationship so she does not want to leave it.  She also shared that she is not sure what to do concerning dating because she is not finding anybody that she feels positive about online and does not know how to meet other people.  Patient was able to recognize that she has had 2 invitations for Thanksgiving and that is a positive thing.  She went on to share that she feels like she makes lots of decisions based on the need for sleep and fear that she will not get enough  sleep.  Patient was encouraged to think through what she really wants and to recognize that she may need to make some changes in her perceptions to be able to get there.  At end of session patient was able to verbalize a need to work on how she interacts or feels about interactions with peers.  Patient was able to share that a friend had reached out and she enjoyed the contact but was fearful of getting too close.  Ways to talk herself through that were discussed with patient and she was encouraged to reach out to her friend and to make sure she is doing some sort of interacting over the holidays.  Interventions: Solution-Oriented/Positive Psychology and Insight-Oriented  Diagnosis:   ICD-10-CM   1. Episodic mood disorder ([place])  F39       Plan:  Patient is to utilize CBT and coping skills.  Patient is to work on j finding different positive peer interactions over the holiday season.  Patient is to continue trying to remind herself that things can be different.  Patient is to remind herself of the things that she is thankful for and the positive.  Patient is to work on affirming herself regularly and focusing on the things that she can control fix and change.  Patient was encouraged to remind herself to take things 1 step at a time and acknowledged the things  that she has done positively.  Patient is to work on journaling and exercising to release negative emotions appropriately between sessions.  Patient is to work on Air traffic controller.  Patient is to practice grounding exercises discussed in session.  Patient is to take medication as directed Long-term goal: Develop a consistent positive self image Short-term goal: Identify positive things about self    [name], [facility]

## 2022-07-10 ENCOUNTER — Other Ambulatory Visit (HOSPITAL_COMMUNITY): Payer: Self-pay

## 2022-07-10 ENCOUNTER — Other Ambulatory Visit: Payer: Self-pay | Admitting: Psychiatry

## 2022-07-10 DIAGNOSIS — F39 Unspecified mood [affective] disorder: Secondary | ICD-10-CM

## 2022-07-11 ENCOUNTER — Telehealth: Payer: Self-pay | Admitting: Psychiatry

## 2022-07-11 ENCOUNTER — Other Ambulatory Visit (HOSPITAL_COMMUNITY): Payer: Self-pay

## 2022-07-11 DIAGNOSIS — F39 Unspecified mood [affective] disorder: Secondary | ICD-10-CM

## 2022-07-11 MED ORDER — DIVALPROEX SODIUM ER 500 MG PO TB24
1000.0000 mg | ORAL_TABLET | Freq: Every day | ORAL | 0 refills | Status: DC
  Filled 2022-07-11: qty 20, 10d supply, fill #0

## 2022-07-11 MED ORDER — SUBVENITE 100 MG PO TABS
100.0000 mg | ORAL_TABLET | Freq: Every day | ORAL | 0 refills | Status: DC
  Filled 2022-07-11: qty 30, 30d supply, fill #0

## 2022-07-11 NOTE — Telephone Encounter (Signed)
Pt called and said that her mail order will not get out her depakote out to her until next Friday. She asking for a week to 10 day supply be sent to [facility] long outpatient pharmacy

## 2022-07-11 NOTE — Telephone Encounter (Signed)
Rx sent for 10 days to requested pharmacy.

## 2022-07-13 ENCOUNTER — Other Ambulatory Visit (HOSPITAL_COMMUNITY): Payer: Self-pay

## 2022-07-17 ENCOUNTER — Ambulatory Visit: Payer: 59 | Admitting: Psychiatry

## 2022-08-07 ENCOUNTER — Other Ambulatory Visit: Payer: Self-pay | Admitting: Psychiatry

## 2022-08-07 ENCOUNTER — Other Ambulatory Visit (HOSPITAL_COMMUNITY): Payer: Self-pay

## 2022-08-07 DIAGNOSIS — F39 Unspecified mood [affective] disorder: Secondary | ICD-10-CM

## 2022-08-09 ENCOUNTER — Other Ambulatory Visit (HOSPITAL_COMMUNITY): Payer: Self-pay

## 2022-08-09 ENCOUNTER — Ambulatory Visit (INDEPENDENT_AMBULATORY_CARE_PROVIDER_SITE_OTHER): Payer: 59 | Admitting: Psychiatry

## 2022-08-09 DIAGNOSIS — F39 Unspecified mood [affective] disorder: Secondary | ICD-10-CM | POA: Diagnosis not present

## 2022-08-09 MED ORDER — SUBVENITE 100 MG PO TABS
100.0000 mg | ORAL_TABLET | Freq: Every day | ORAL | 0 refills | Status: DC
  Filled 2022-08-09: qty 30, 30d supply, fill #0

## 2022-08-09 NOTE — Progress Notes (Signed)
      [facility] Counselor/Therapist Progress Note  Patient ID: Cindy Woods, MRN: [id number],    Date: 08/09/2022  Time Spent: 50 minutes start time 4:05 PM end time 4:55 PM  Treatment Type: Individual Therapy  Reported Symptoms:  lablie, fatigue, low motivation, rumination, triggered responses  Mental Status Exam:  Appearance:   Well Groomed     Behavior:  Appropriate  Motor:  Normal  Speech/Language:   Normal Rate  Affect:  Labile  Mood:  labile  Thought process:  normal  Thought content:    WNL  Sensory/Perceptual disturbances:    WNL  Orientation:  oriented to person, place, time/date, and situation  Attention:  Good  Concentration:  Good  Memory:  WNL  Fund of knowledge:   Good  Insight:    Good  Judgment:   Good  Impulse Control:  Good   Risk Assessment: Danger to Self:  No Self-injurious Behavior: No Danger to Others: No Duty to Warn:no Physical Aggression / Violence:No  Access to Firearms a concern: No  Gang Involvement:No   Subjective: Patient was present for session. She shared that things are about the same. Patient shared that she struggled through Christmas. She explained that it is hurtful in many ways because her family was not supportive and reaching out.  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  Patient shared that the biggest issue is her work and feeling frustrated with the situation she is having to address.  Patient was encouraged to figure out what perspective she would need to have to be able to stay in the work situation since that seems to be creating most of the frustration for her.  Patient was able to think of some CBT filters that she thought  might help.  Ways to utilize them were discussed with patient.  Interventions: Cognitive Behavioral Therapy and Solution-Oriented/Positive Psychology  Diagnosis:   ICD-10-CM   1. Episodic mood disorder ([place])  F39       Plan: Patient is to utilize CBT and coping skills.  Patient is to work on j finding different positive peer interactions over the holiday season.  Patient is to continue trying to remind herself that things can be different.  Patient is to remind herself of the things that she is thankful for and the positive.  Patient is to work on affirming herself regularly and focusing on the things that she can control fix and change.  Patient was encouraged to remind herself to take things 1 step at a time and acknowledged the things that she has done positively.  Patient is to work on journaling and exercising to release negative emotions appropriately between sessions.  Patient is to work on Air traffic controller.  Patient is to practice grounding exercises discussed in session.  Patient is to take medication as directed Long-term goal: Develop a consistent positive self image Short-term goal: Identify positive things about self    Cindy Woods, [facility]

## 2022-08-11 ENCOUNTER — Other Ambulatory Visit (HOSPITAL_COMMUNITY): Payer: Self-pay

## 2022-08-13 ENCOUNTER — Other Ambulatory Visit (HOSPITAL_COMMUNITY): Payer: Self-pay

## 2022-08-13 ENCOUNTER — Encounter: Payer: Self-pay | Admitting: Psychiatry

## 2022-08-13 ENCOUNTER — Ambulatory Visit (INDEPENDENT_AMBULATORY_CARE_PROVIDER_SITE_OTHER): Payer: 59 | Admitting: Psychiatry

## 2022-08-13 DIAGNOSIS — F39 Unspecified mood [affective] disorder: Secondary | ICD-10-CM | POA: Diagnosis not present

## 2022-08-13 DIAGNOSIS — F5101 Primary insomnia: Secondary | ICD-10-CM | POA: Diagnosis not present

## 2022-08-13 DIAGNOSIS — F419 Anxiety disorder, unspecified: Secondary | ICD-10-CM

## 2022-08-13 MED ORDER — ALPRAZOLAM 0.25 MG PO TABS: ORAL_TABLET | ORAL | 4 refills | Status: DC

## 2022-08-13 MED ORDER — SUBVENITE 100 MG PO TABS
100.0000 mg | ORAL_TABLET | Freq: Every day | ORAL | 2 refills | Status: DC
  Filled 2022-08-13: qty 90, 90d supply, fill #0
  Filled 2022-09-10: qty 30, 30d supply, fill #0
  Filled 2022-10-10: qty 30, 30d supply, fill #1
  Filled 2022-11-08: qty 30, 30d supply, fill #2
  Filled 2022-12-07: qty 30, 30d supply, fill #3
  Filled 2023-01-04: qty 30, 30d supply, fill #4

## 2022-08-13 MED ORDER — DIVALPROEX SODIUM ER 500 MG PO TB24: 1000.0000 mg | ORAL_TABLET | Freq: Every day | ORAL | 2 refills | Status: DC

## 2022-08-13 MED ORDER — ILOPERIDONE 2 MG PO TABS: 1.0000 | ORAL_TABLET | Freq: Every day | ORAL | 1 refills | Status: DC

## 2022-08-13 NOTE — Progress Notes (Addendum)
Cindy Woods [id number] 1976-03-27 47 y.o.  Subjective:   [name] is a 47 y.o. (DOB 07-15-1976) female.  Chief Complaint:  Chief Complaint  Patient presents with   Follow-up    Anxiety, insomnia, mood disturbance    HPI Cindy Woods presents to the office today for follow-up of mood disturbance, anxiety, and insomnia. She reports that she has been feeling "ok." She reports that she is not taking any Xanax- "just a sliver" in order to help fall asleep. She reports that she feels like she is sleeping deeper now and sometimes sleeps through her alarm. She reports that she is probably not getting enough sleep since she is unable to go to bed early enough. She reports feeling like she needs 10 hours of sleep to feel rested. She reports that she is not able to function on 5-6 hours of sleep. She reports that her anxiety has "been fine." Denies depressed mood. She reports some mild irritability and misophonia. Denies impulsivity or risky behavior. She reports that her energy and motivation are ok. Energy low at the end of the work day. Concentration is ok. Denies any change in diet. Appetite has been fair. Denies SI.   Denies any significant psychosocial changes.   Alprazolam last filled 04/09/22.  Past Psychiatric Medication Trials: Seroquel Viibryd Wellbutrin Klonopin Latuda Abilify Fanapt- Initially helpful at 1 mg Lithium Zoloft-Adverse effects Effexor- May have been helpful Celexa Paxil Prozac BuSpar Lexapro Nuvigil Sonata- Was no longer effective and then was having to take more. Reports that she has had some residual grogginess the following day.  Xanax Rexulti-restlessness Ativan Hydroxyzine Trintellix Temazepam Mirtazapine- Excessive daytime somnolence.  Lamictal- anxious side effect.  Subvenite has been more effective and better tolerated Subvenite- more effective and better tolerated compared to  generic Depakote Trazodone Dayvigo- Minimally effective and excessive grogginess Gabapentin- excessive somnolence  AIMS    Flowsheet Row Office Visit from 08/13/2022 in [facility] Office Visit from 03/02/2021 in [facility] Office Visit from 04/26/2020 in [facility]  AIMS Total Score 0 0 0      GAD-7    Flowsheet Row Office Visit from 02/01/2022 in [facility]  Total GAD-7 Score 2      PHQ2-9    Flowsheet Row Office Visit from 02/01/2022 in [facility] Office Visit from 01/26/2021 in [facility] Office Visit from 01/13/2018 in [place]  PHQ-2 Total Score 2 1 0  PHQ-9 Total Score 5 8 0        Review of Systems:  Review of Systems  Constitutional:  Positive for fatigue.  Cardiovascular:  Negative for chest pain and palpitations.       Denies irregular HR  Musculoskeletal:  Negative for gait problem.  Hematological:        Iron was low  Psychiatric/Behavioral:         Please refer to HPI    Medications: I have reviewed the patient's current medications.  Current Outpatient Medications  Medication Sig Dispense Refill   ferrous sulfate 325 (65 FE) MG tablet Take 325 mg by mouth daily with breakfast.     Melatonin 1 MG CAPS Take by mouth.     Multiple Vitamin (MULTIVITAMIN) tablet Take 1 tablet by mouth daily.     ALPRAZolam (XANAX) 0.25 MG tablet TAKE 1 TO 2 TABLETS BY MOUTH AT  BEDTIME 60 tablet 4   divalproex (DEPAKOTE ER) 500 MG 24 hr tablet Take  2 tablets (1,000 mg total) by mouth daily. 180 tablet 2   Iloperidone 2 MG TABS TAKE 1 TABLET BY MOUTH AT BEDTIME 90 tablet 1   metoprolol succinate (TOPROL XL) 25 MG 24 hr tablet Take 1 tablet (25 mg total) by mouth at bedtime. (Patient not taking: Reported on 08/13/2022) 90 tablet 1   SUBVENITE 100 MG tablet Take 1 tablet (100 mg total) by mouth daily. 90 tablet 2   No current facility-administered medications for this visit.    Medication  Side Effects: None  Allergies: No Known Allergies  Past Medical History:  Diagnosis Date   Bipolar II disorder (HCC) 06/15/2018    Past Medical History, Surgical history, Social history, and Family history were reviewed and updated as appropriate.   Please see review of systems for further details on the patient's review from today.   Objective:   Physical Exam:  There were no vitals taken for this visit.  Physical Exam Constitutional:      General: She is not in acute distress. Musculoskeletal:        General: No deformity.  Neurological:     Mental Status: She is alert and oriented to person, place, and time.     Coordination: Coordination normal.  Psychiatric:        Attention and Perception: Attention and perception normal. She does not perceive auditory or visual hallucinations.        Mood and Affect: Mood normal. Mood is not anxious or depressed. Affect is not labile, blunt, angry or inappropriate.        Speech: Speech normal.        Behavior: Behavior normal.        Thought Content: Thought content normal. Thought content is not paranoid or delusional. Thought content does not include homicidal or suicidal ideation. Thought content does not include homicidal or suicidal plan.        Cognition and Memory: Cognition and memory normal.        Judgment: Judgment normal.     Comments: Insight intact     Lab Review:     Component Value Date/Time   NA 141 06/19/2021 1611   K 4.3 06/19/2021 1611   CL 103 06/19/2021 1611   CO2 24 06/19/2021 1611   GLUCOSE 106 (H) 06/19/2021 1611   BUN 12 06/19/2021 1611   CREATININE 0.77 06/19/2021 1611   CALCIUM 9.0 06/19/2021 1611   PROT 6.6 06/19/2021 1611   ALBUMIN 4.3 06/19/2021 1611   AST 10 06/19/2021 1611   ALT 8 06/19/2021 1611   ALKPHOS 50 06/19/2021 1611   BILITOT <0.2 06/19/2021 1611   GFRNONAA 82 07/16/2018 1324   GFRAA 95 07/16/2018 1324       Component Value Date/Time   WBC 5.7 07/16/2018 1324   RBC 4.10  07/16/2018 1324   HGB 13.1 07/16/2018 1324   HCT 38.8 07/16/2018 1324   PLT 186 07/16/2018 1324   MCV 95 07/16/2018 1324   MCH 32.0 07/16/2018 1324   MCHC 33.8 07/16/2018 1324   RDW 11.8 (L) 07/16/2018 1324   LYMPHSABS 2.0 07/16/2018 1324   EOSABS 0.2 07/16/2018 1324   BASOSABS 0.0 07/16/2018 1324    No results found for: "POCLITH", "LITHIUM"   Lab Results  Component Value Date   VALPROATE 65 06/19/2021     .res Assessment: Plan:    Pt seen for 25 minutes and time spent discussing treatment plan and response to medications. She reports that overall her mood, anxiety, and  insomnia symptoms have been well controlled with current medications and she prefers to continue current meds without changes. She reports that she has been attempting to reduce Xanax to lowest possible effective dose and plans to continue to lower dose if possible, however she would like to continue current script in case she requires current dose at times.  Continue Subvenite 100 mg po qd for mood symptoms. Continue Fanapt 2 mg po QHS for mood, anxiety, and insomnia.  Continue Alprazolam 0.25 mg 1-2 tabs po QHS for anxiety and/or insomnia.  Continue Depakote ER 1000 mg po QHS for mood symptoms.  Recommend continuing therapy with Stevphen Meuse, [facility].  Pt to follow-up in 6 months or sooner if clinically indicated.  Patient advised to contact office with any questions, adverse effects, or acute worsening in signs and symptoms.   Cindy Woods was seen today for follow-up.  Diagnoses and all orders for this visit:  Anxiety disorder, unspecified type -     Iloperidone 2 MG TABS; TAKE 1 TABLET BY MOUTH AT BEDTIME -     ALPRAZolam (XANAX) 0.25 MG tablet; TAKE 1 TO 2 TABLETS BY MOUTH AT  BEDTIME  Primary insomnia -     Iloperidone 2 MG TABS; TAKE 1 TABLET BY MOUTH AT BEDTIME -     ALPRAZolam (XANAX) 0.25 MG tablet; TAKE 1 TO 2 TABLETS BY MOUTH AT  BEDTIME  Episodic mood disorder (HCC) -     divalproex (DEPAKOTE ER)  500 MG 24 hr tablet; Take 2 tablets (1,000 mg total) by mouth daily. -     SUBVENITE 100 MG tablet; Take 1 tablet (100 mg total) by mouth daily.     Please see After Visit Summary for patient specific instructions.  Future Appointments  Date Time Provider Department Center  10/02/2022  3:00 PM Stevphen Meuse, [facility] CP-CP None  10/18/2022  4:00 PM Stevphen Meuse, [facility] CP-CP None  11/01/2022  4:00 PM Stevphen Meuse, [facility] CP-CP None  11/15/2022  4:00 PM Stevphen Meuse, [facility] CP-CP None  11/29/2022  4:00 PM Stevphen Meuse, [facility] CP-CP None  02/11/2023  4:15 PM Corie Chiquito, PMHNP CP-CP None    No orders of the defined types were placed in this encounter.   -------------------------------

## 2022-08-14 ENCOUNTER — Other Ambulatory Visit (HOSPITAL_COMMUNITY): Payer: Self-pay

## 2022-09-11 ENCOUNTER — Other Ambulatory Visit: Payer: Self-pay

## 2022-09-11 ENCOUNTER — Other Ambulatory Visit (HOSPITAL_COMMUNITY): Payer: Self-pay

## 2022-10-02 ENCOUNTER — Ambulatory Visit (INDEPENDENT_AMBULATORY_CARE_PROVIDER_SITE_OTHER): Payer: 59 | Admitting: Psychiatry

## 2022-10-02 DIAGNOSIS — F39 Unspecified mood [affective] disorder: Secondary | ICD-10-CM | POA: Diagnosis not present

## 2022-10-02 NOTE — Progress Notes (Signed)
[facility] Counselor/Therapist Progress Note  Patient ID: Cindy Woods, MRN: [id number],    Date: 10/02/2022  Time Spent: 50 minutes start time 3:03 PM end time 3:53 PM  Treatment Type: Individual Therapy  Reported Symptoms: fatigue, anxiety, sadness, feeling some manic emotions, obsessive thinking  Mental Status Exam:  Appearance:   Well Groomed     Behavior:  Appropriate  Motor:  Normal  Speech/Language:   Normal Rate  Affect:  Appropriate  Mood:  sad  Thought process:  normal  Thought content:    WNL  Sensory/Perceptual disturbances:    WNL  Orientation:  oriented to person, place, time/date, and situation  Attention:  Good  Concentration:  Good  Memory:  WNL  Fund of knowledge:   Good  Insight:    Good  Judgment:   Good  Impulse Control:  Good   Risk Assessment: Danger to Self:  No Self-injurious Behavior: No Danger to Others: No Duty to Warn:no Physical Aggression / Violence:No  Access to Firearms a concern: No  Gang Involvement:No   Subjective: Patient was present for session. She shared she went through a period of extreme fatigue. She went on to share that she was able to get perspective. She ended the relationship with Tim and she has developed more of a social life. She went on to share that she is realizing that she may need to change jobs and that is okay now.  Patient explained that the thing that is disturbing her the most is that she is having some obsessive thinking over somebody at work.  Patient stated it is not what she wants to have and that is very frustrating for her.  She shares she tries just to tell herself to stop thoughts but that has not working.  Discussed the fact that replacing the automatic thoughts or redirecting her thoughts on positive may be more effective way to manage the obsessive thinking.  She was encouraged to recognize what her obsessive thoughts and feelings were telling her.  She was able to recognize that they are  trying to let her know she wants a different relationship.  She was able to recognize that things with her dad have always been difficult and she feels that not enough for him and that probably comes out in relationships with men as well.  Patient was encouraged to remind herself of the facts and to recognize her strength and power and remind herself that she is enough.  Patient is also to try and write down any of the positive things so that she can read them when she starts feeling not enough.  Interventions: Cognitive Behavioral Therapy and Solution-Oriented/Positive Psychology  Diagnosis:   ICD-10-CM   1. Episodic mood disorder ([place])  F39       Plan:  Patient is to utilize CBT and coping skills.  Patient is to work on recognizing positive things and to write them down as she needs to to read them.  She is also to work on reminding herself of fax/truce with the obsessive thoughts that start to enter her mind..  Patient is to continue trying to remind herself that things can be different.  Patient is to remind herself of the things that she is thankful for and the positive.  Patient is to work on affirming herself regularly and focusing on the things that she can control fix and change.  Patient was encouraged to remind herself to take things 1 step at a time and  acknowledged the things that she has done positively.  Patient is to work on journaling and exercising to release negative emotions appropriately between sessions.  Patient is to work on Air traffic controller.  Patient is to practice grounding exercises discussed in session.  Patient is to take medication as directed Long-term goal: Develop a consistent positive self image Short-term goal: Identify positive things about self  [name], [facility]

## 2022-10-10 ENCOUNTER — Other Ambulatory Visit (HOSPITAL_COMMUNITY): Payer: Self-pay

## 2022-10-11 ENCOUNTER — Other Ambulatory Visit (HOSPITAL_COMMUNITY): Payer: Self-pay

## 2022-10-18 ENCOUNTER — Ambulatory Visit (INDEPENDENT_AMBULATORY_CARE_PROVIDER_SITE_OTHER): Payer: 59 | Admitting: Psychiatry

## 2022-10-18 DIAGNOSIS — F39 Unspecified mood [affective] disorder: Secondary | ICD-10-CM

## 2022-10-18 NOTE — Progress Notes (Signed)
      [facility] Counselor/Therapist Progress Note  Patient ID: Cindy Woods, MRN: [id number],    Date: 10/18/2022  Time Spent: 50 minutes start time 4:06 PM end time 4:56 PM  Treatment Type: Individual Therapy  Reported Symptoms: anxiety, triggered responses, sadness, rumination  Mental Status Exam:  Appearance:   Well Groomed     Behavior:  Appropriate  Motor:  Normal  Speech/Language:   Normal Rate  Affect:  Appropriate  Mood:  anxious  Thought process:  normal  Thought content:    WNL  Sensory/Perceptual disturbances:    WNL  Orientation:  oriented to person, place, time/date, and situation  Attention:  Good  Concentration:  Good  Memory:  WNL  Fund of knowledge:   Good  Insight:    Good  Judgment:   Good  Impulse Control:  Good   Risk Assessment: Danger to Self:  No Self-injurious Behavior: No Danger to Others: No Duty to Warn:no Physical Aggression / Violence:No  Access to Firearms a concern: No  Gang Involvement:No   Subjective: Patient was present for session. She shared that was an issue at work that made her feel she has a pride problem that was upsetting to her.  Patient was able to process through what had happened.  Discussed importance of practicing being present with others. Patient was able to recognize she is typically focused on what she has to do and doesn't pay attention to the people around her and that may be creating the issues she is having.  Developed a plan to help her start changing that behavior.  Interventions: Solution-Oriented/Positive Psychology  Diagnosis:   ICD-10-CM   1. Episodic mood disorder ([place])  F39       Plan:   Patient is to utilize CBT and coping skills. Patient is to work on noticing other's facial and body language when she walks into a room. Patient is to work on recognizing positive things and to write them down as she needs to to read them.  She is also to work on reminding herself of fax/truce with the  obsessive thoughts that start to enter her mind..  Patient is to continue trying to remind herself that things can be different.  Patient is to remind herself of the things that she is thankful for and the positive.  Patient is to work on affirming herself regularly and focusing on the things that she can control fix and change.  Patient was encouraged to remind herself to take things 1 step at a time and acknowledged the things that she has done positively.  Patient is to work on journaling and exercising to release negative emotions appropriately between sessions.  Patient is to work on Air traffic controller.  Patient is to practice grounding exercises discussed in session.  Patient is to take medication as directed Long-term goal: Develop a consistent positive self image Short-term goal: Identify positive things about self  [name], [facility]

## 2022-11-01 ENCOUNTER — Ambulatory Visit (INDEPENDENT_AMBULATORY_CARE_PROVIDER_SITE_OTHER): Payer: 59 | Admitting: Psychiatry

## 2022-11-01 DIAGNOSIS — F39 Unspecified mood [affective] disorder: Secondary | ICD-10-CM | POA: Diagnosis not present

## 2022-11-01 NOTE — Progress Notes (Signed)
      [facility] Counselor/Therapist Progress Note  Patient ID: Cindy Woods, MRN: [id number],    Date: 11/01/2022  Time Spent: 50 minutes start time 4:10  end time 5:00 PM  Treatment Type: Individual Therapy  Reported Symptoms: sadness, anxiety, irritability  Mental Status Exam:  Appearance:   Well Groomed     Behavior:  Appropriate  Motor:  Normal  Speech/Language:   Normal Rate  Affect:  Appropriate  Mood:  anxious  Thought process:  normal  Thought content:    WNL  Sensory/Perceptual disturbances:    WNL  Orientation:  oriented to person, place, time/date, and situation  Attention:  Good  Concentration:  Good  Memory:  WNL  Fund of knowledge:   Good  Insight:    Good  Judgment:   Good  Impulse Control:  Good   Risk Assessment: Danger to Self:  No Self-injurious Behavior: No Danger to Others: No Duty to Warn:no Physical Aggression / Violence:No  Access to Firearms a concern: No  Gang Involvement:No   Subjective: Patient was present for session. She shared she is struggling with what to do  concerning finding friendships and relationships.  She is getting involved with her church but the singles she could do things with are not the type of people she is typically involved.  She shared she is doing better with things at school which is good but she is still frustrated with how she feels about students and coworkers. Gave patient handouts on cognitive distortions and talked with her about how she gets into all or nothing thinking and how that gets her stuck.  Patient was encouraged to also work on what she feels thankful for and to remind herself that she is enough and others do want to have a relationship with her.  Interventions: Cognitive Behavioral Therapy and Solution-Oriented/Positive Psychology  Diagnosis:   ICD-10-CM   1. Episodic mood disorder ([place])  F39       Plan: Patient is to utilize CBT and coping skills. Patient is to work on noticing  other's facial and body language when she walks into a room. Patient is to work on recognizing positive things and to write them down as she needs to to read them.  She is also to work on reminding herself of fax/truce with the obsessive thoughts that start to enter her mind..  Patient is to continue trying to remind herself that things can be different.  Patient is to remind herself of the things that she is thankful for and the positive.  Patient is to work on affirming herself regularly and focusing on the things that she can control fix and change.  Patient was encouraged to remind herself to take things 1 step at a time and acknowledged the things that she has done positively.  Patient is to work on journaling and exercising to release negative emotions appropriately between sessions.  Patient is to work on Air traffic controller.  Patient is to practice grounding exercises discussed in session.  Patient is to take medication as directed Long-term goal: Develop a consistent positive self image Short-term goal: Identify positive things about self    [name], [facility]

## 2022-11-07 ENCOUNTER — Other Ambulatory Visit (HOSPITAL_COMMUNITY): Payer: Self-pay

## 2022-11-07 MED ORDER — PROGESTERONE 200 MG PO CAPS
200.0000 mg | ORAL_CAPSULE | Freq: Every evening | ORAL | 0 refills | Status: DC
  Filled 2022-11-07: qty 10, 10d supply, fill #0

## 2022-11-08 ENCOUNTER — Other Ambulatory Visit (HOSPITAL_COMMUNITY): Payer: Self-pay

## 2022-11-14 ENCOUNTER — Telehealth: Payer: Self-pay | Admitting: Psychiatry

## 2022-11-14 ENCOUNTER — Other Ambulatory Visit (HOSPITAL_COMMUNITY): Payer: Self-pay

## 2022-11-14 DIAGNOSIS — F5101 Primary insomnia: Secondary | ICD-10-CM

## 2022-11-14 DIAGNOSIS — F419 Anxiety disorder, unspecified: Secondary | ICD-10-CM

## 2022-11-14 MED ORDER — ALPRAZOLAM 0.25 MG PO TABS
0.2500 mg | ORAL_TABLET | Freq: Every evening | ORAL | 0 refills | Status: DC
  Filled 2022-11-14: qty 6, 3d supply, fill #0

## 2022-11-14 NOTE — Telephone Encounter (Signed)
Please let her know it has been sent.  

## 2022-11-14 NOTE — Telephone Encounter (Signed)
Pt called requesting Rx for Alprazolam to [facility] pharmacy for 3 days. Mail order will not arrive until Friday. She needs for tonight. Apt 7/8

## 2022-11-15 ENCOUNTER — Ambulatory Visit: Payer: 59 | Admitting: Psychiatry

## 2022-11-15 NOTE — Telephone Encounter (Signed)
Called Pt and had to LVM

## 2022-11-29 ENCOUNTER — Ambulatory Visit: Payer: 59 | Admitting: Psychiatry

## 2022-12-07 ENCOUNTER — Other Ambulatory Visit (HOSPITAL_COMMUNITY): Payer: Self-pay

## 2023-01-04 ENCOUNTER — Other Ambulatory Visit (HOSPITAL_COMMUNITY): Payer: Self-pay

## 2023-01-17 ENCOUNTER — Other Ambulatory Visit (HOSPITAL_COMMUNITY): Payer: Self-pay

## 2023-01-17 MED ORDER — FLUTICASONE PROPIONATE 0.05 % EX CREA
TOPICAL_CREAM | CUTANEOUS | 2 refills | Status: DC
  Filled 2023-01-17: qty 30, 30d supply, fill #0
  Filled 2023-01-31: qty 30, 14d supply, fill #0

## 2023-01-18 ENCOUNTER — Other Ambulatory Visit (HOSPITAL_COMMUNITY): Payer: Self-pay

## 2023-01-18 ENCOUNTER — Telehealth: Payer: Self-pay | Admitting: Psychiatry

## 2023-01-18 DIAGNOSIS — F419 Anxiety disorder, unspecified: Secondary | ICD-10-CM

## 2023-01-18 DIAGNOSIS — F5101 Primary insomnia: Secondary | ICD-10-CM

## 2023-01-18 MED ORDER — ILOPERIDONE 1 MG PO TABS
1.0000 mg | ORAL_TABLET | Freq: Every day | ORAL | 2 refills | Status: DC
Start: 2023-01-18 — End: 2023-01-22
  Filled 2023-01-18: qty 30, 30d supply, fill #0

## 2023-01-18 NOTE — Telephone Encounter (Signed)
Pt called and said that she needs a new script of lloperidone 1 mg. She wants to go down to 1 mg from 2 mg. She wants a 30 day supply sent into [facility]. It is cheaper as a 30 day.

## 2023-01-18 NOTE — Telephone Encounter (Signed)
Script sent  

## 2023-01-22 ENCOUNTER — Other Ambulatory Visit (HOSPITAL_COMMUNITY): Payer: Self-pay

## 2023-01-22 ENCOUNTER — Encounter: Payer: Self-pay | Admitting: Psychiatry

## 2023-01-22 ENCOUNTER — Other Ambulatory Visit: Payer: Self-pay

## 2023-01-22 ENCOUNTER — Ambulatory Visit (INDEPENDENT_AMBULATORY_CARE_PROVIDER_SITE_OTHER): Payer: 59 | Admitting: Psychiatry

## 2023-01-22 DIAGNOSIS — F419 Anxiety disorder, unspecified: Secondary | ICD-10-CM | POA: Diagnosis not present

## 2023-01-22 DIAGNOSIS — F39 Unspecified mood [affective] disorder: Secondary | ICD-10-CM

## 2023-01-22 DIAGNOSIS — F5101 Primary insomnia: Secondary | ICD-10-CM

## 2023-01-22 MED ORDER — ILOPERIDONE 1 MG PO TABS
1.0000 mg | ORAL_TABLET | Freq: Every day | ORAL | 1 refills | Status: DC
Start: 2023-01-22 — End: 2023-07-08

## 2023-01-22 MED ORDER — SUBVENITE 100 MG PO TABS
100.0000 mg | ORAL_TABLET | Freq: Every day | ORAL | 2 refills | Status: DC
Start: 2023-01-22 — End: 2023-07-24
  Filled 2023-01-22: qty 30, 30d supply, fill #0
  Filled 2023-03-08: qty 30, 30d supply, fill #1
  Filled 2023-04-06: qty 30, 30d supply, fill #2
  Filled 2023-05-08: qty 30, 30d supply, fill #3
  Filled 2023-06-06: qty 30, 30d supply, fill #4
  Filled 2023-07-06: qty 30, 30d supply, fill #5

## 2023-01-22 MED ORDER — ALPRAZOLAM 0.25 MG PO TABS
ORAL_TABLET | ORAL | 5 refills | Status: DC
Start: 2023-01-31 — End: 2023-06-13

## 2023-01-22 MED ORDER — DIVALPROEX SODIUM ER 500 MG PO TB24
1000.0000 mg | ORAL_TABLET | Freq: Every day | ORAL | 1 refills | Status: DC
Start: 2023-01-22 — End: 2023-07-09

## 2023-01-22 NOTE — Progress Notes (Signed)
Cindy Woods [id number] [birth year] 47 y.o.  Subjective:   [name] is a 47 y.o. (DOB [birth year]) female.  Chief Complaint:  Chief Complaint  Patient presents with   Follow-up    Anxiety, mood disturbance, sleep disturbance    HPI Cindy Woods presents to the office today for follow-up of mood disturbance, anxiety, and sleep disturbance. She reports, "I've had a lot of fatigue." She reports that she was diagnosed with anemia and her fatigue improved some with supplements. She started samples of an OCP this week and notices more fatigue since then.  She has tried splitting fanapt 2 mg in half and "it has been a little easier to get up in the morning." She reports that she would like to reduce fanapt to 1 mg at bedtime. She reports sleeping 8-9 hours. Anxiety has been "fine." She reports that she has had some depression and this prompted visit to obgyn for hormone treatment. She reports that one day she had a sudden onset of severe depression during her work day. She reports that this resolved within a few minutes. Denies any other similar episodes. She reports some mild depression at baseline. Motivation has been fine. Concentration has been ok. Reports that she was able to read and focus without difficulty while studying for a certification exam. Appetite has been decreased. Denies SI.   She has been home more during the summer.   Past Psychiatric Medication Trials: Seroquel Viibryd Wellbutrin Klonopin Latuda Abilify Fanapt- Initially helpful at 1 mg Lithium Zoloft-Adverse effects Effexor- May have been helpful Celexa Paxil Prozac BuSpar Lexapro Nuvigil Sonata- Was no longer effective and then was having to take more. Reports that she has had some residual grogginess the following day.  Xanax Rexulti-restlessness Ativan Hydroxyzine Trintellix Temazepam Mirtazapine- Excessive daytime somnolence.  Lamictal- anxious side  effect.  Subvenite has been more effective and better tolerated Subvenite- more effective and better tolerated compared to generic Depakote Trazodone Dayvigo- Minimally effective and excessive grogginess Gabapentin- excessive somnolence   AIMS    Flowsheet Row Office Visit from 01/22/2023 in [facility] Office Visit from 08/13/2022 in [facility] Office Visit from 03/02/2021 in [facility] Office Visit from 04/26/2020 in [facility]  AIMS Total Score 0 0 0 0      GAD-7    Flowsheet Row Office Visit from 02/01/2022 in [facility]  Total GAD-7 Score 2      PHQ2-9    Flowsheet Row Office Visit from 02/01/2022 in [facility] at [facility] Office Visit from 01/26/2021 in [facility] at [facility] Office Visit from 01/13/2018 in [place]  PHQ-2 Total Score 2 1 0  PHQ-9 Total Score 5 8 0        Review of Systems:  Review of Systems  Musculoskeletal:  Negative for gait problem.  Skin:        Recent rash from poison ivy  Neurological:  Negative for tremors.  Psychiatric/Behavioral:         Please refer to HPI    Medications: I have reviewed the patient's current medications.  Current Outpatient Medications  Medication Sig Dispense Refill   ferrous sulfate 325 (65 FE) MG tablet Take 325 mg by mouth daily with breakfast.     Iloperidone 2 MG TABS TAKE 1 TABLET BY MOUTH AT BEDTIME 90 tablet  1   Melatonin 1 MG CAPS Take by mouth.     [START ON 01/31/2023] ALPRAZolam (XANAX) 0.25 MG tablet TAKE 1 TO 2 TABLETS BY MOUTH AT  BEDTIME 60 tablet 5   divalproex (DEPAKOTE ER) 500 MG 24 hr tablet Take 2 tablets (1,000 mg total) by mouth daily. 180 tablet 1   fluticasone (CUTIVATE) 0.05 % cream Apply to affected area twice daily X 1 week, then take 1 week off. Repeat as needed for  flare. 30 g 2   Iloperidone 1 MG TABS Take 1 tablet (1 mg total) by mouth at bedtime. 90 tablet 1   SUBVENITE 100 MG tablet Take 1 tablet (100 mg total) by mouth daily. 90 tablet 2   No current facility-administered medications for this visit.    Medication Side Effects: Difficulty waking up in the morning  Allergies: No Known Allergies  Past Medical History:  Diagnosis Date   Bipolar II disorder (HCC) 06/15/2018    Past Medical History, Surgical history, Social history, and Family history were reviewed and updated as appropriate.   Please see review of systems for further details on the patient's review from today.   Objective:   Physical Exam:  There were no vitals taken for this visit.  Physical Exam Constitutional:      General: She is not in acute distress. Musculoskeletal:        General: No deformity.  Neurological:     Mental Status: She is alert and oriented to person, place, and time.     Coordination: Coordination normal.  Psychiatric:        Attention and Perception: Attention and perception normal. She does not perceive auditory or visual hallucinations.        Mood and Affect: Mood is not anxious. Affect is not labile, blunt, angry or inappropriate.        Speech: Speech normal.        Behavior: Behavior normal.        Thought Content: Thought content normal. Thought content is not paranoid or delusional. Thought content does not include homicidal or suicidal ideation. Thought content does not include homicidal or suicidal plan.        Cognition and Memory: Cognition and memory normal.        Judgment: Judgment normal.     Comments: Insight intact Mood is mildly depressed     Lab Review:     Component Value Date/Time   NA 141 06/19/2021 1611   K 4.3 06/19/2021 1611   CL 103 06/19/2021 1611   CO2 24 06/19/2021 1611   GLUCOSE 106 (H) 06/19/2021 1611   BUN 12 06/19/2021 1611   CREATININE 0.77 06/19/2021 1611   CALCIUM 9.0 06/19/2021 1611   PROT 6.6  06/19/2021 1611   ALBUMIN 4.3 06/19/2021 1611   AST 10 06/19/2021 1611   ALT 8 06/19/2021 1611   ALKPHOS 50 06/19/2021 1611   BILITOT <0.2 06/19/2021 1611   GFRNONAA 82 07/16/2018 1324   GFRAA 95 07/16/2018 1324       Component Value Date/Time   WBC 5.7 07/16/2018 1324   RBC 4.10 07/16/2018 1324   HGB 13.1 07/16/2018 1324   HCT 38.8 07/16/2018 1324   PLT 186 07/16/2018 1324   MCV 95 07/16/2018 1324   MCH 32.0 07/16/2018 1324   MCHC 33.8 07/16/2018 1324   RDW 11.8 (L) 07/16/2018 1324   LYMPHSABS 2.0 07/16/2018 1324   EOSABS 0.2 07/16/2018 1324   BASOSABS 0.0 07/16/2018 1324  No results found for: "POCLITH", "LITHIUM"   Lab Results  Component Value Date   VALPROATE 65 06/19/2021     .res Assessment: Plan:    Pt requesting to reduce Fanapt to 1 mg tabs since she has tried splitting 2 mg tabs in half and felt that it was easier to get up the following morning. She requests that script be sent to mail order pharmacy due to cost.  Continue Subvenite 100 mg daily for mood symptoms.  Continue Depakote ER 1,000 mg po at bedtime for mood stabilization. Continue Alprazolam 0.25 mg 1-2 tabs at bedtime for insomnia and anxiety.  Pt to follow-up in 6 months or sooner if clinically indicated.  Patient advised to contact office with any questions, adverse effects, or acute worsening in signs and symptoms. I spent 30 minutes dedicated to the care of this patient on the date of this  encounter to include pre-visit review of records, face-to-face time with the patient discussing treatment plan, ordering of medication, and post visit documentation.   Lenay was seen today for follow-up.  Diagnoses and all orders for this visit:  Anxiety disorder, unspecified type -     Iloperidone 1 MG TABS; Take 1 tablet (1 mg total) by mouth at bedtime. -     ALPRAZolam (XANAX) 0.25 MG tablet; TAKE 1 TO 2 TABLETS BY MOUTH AT  BEDTIME  Episodic mood disorder (HCC) -     SUBVENITE 100 MG tablet;  Take 1 tablet (100 mg total) by mouth daily. -     divalproex (DEPAKOTE ER) 500 MG 24 hr tablet; Take 2 tablets (1,000 mg total) by mouth daily.  Primary insomnia -     Iloperidone 1 MG TABS; Take 1 tablet (1 mg total) by mouth at bedtime. -     ALPRAZolam (XANAX) 0.25 MG tablet; TAKE 1 TO 2 TABLETS BY MOUTH AT  BEDTIME     Please see After Visit Summary for patient specific instructions.  Future Appointments  Date Time Provider Department Center  02/15/2023 11:00 AM Waldron Session, [facility] None  07/24/2023  8:30 AM Corie Chiquito, PMHNP CP-CP None    No orders of the defined types were placed in this encounter.   -------------------------------

## 2023-01-28 ENCOUNTER — Other Ambulatory Visit (HOSPITAL_COMMUNITY): Payer: Self-pay

## 2023-01-31 ENCOUNTER — Other Ambulatory Visit (HOSPITAL_COMMUNITY): Payer: Self-pay

## 2023-02-01 ENCOUNTER — Other Ambulatory Visit (HOSPITAL_COMMUNITY): Payer: Self-pay

## 2023-02-01 MED ORDER — LIOTHYRONINE SODIUM 5 MCG PO TABS
5.0000 ug | ORAL_TABLET | Freq: Every morning | ORAL | 2 refills | Status: DC
  Filled 2023-02-01: qty 30, 30d supply, fill #0

## 2023-02-11 ENCOUNTER — Ambulatory Visit: Payer: 59 | Admitting: Psychiatry

## 2023-02-15 ENCOUNTER — Ambulatory Visit: Payer: 59 | Admitting: Mental Health

## 2023-02-15 DIAGNOSIS — F419 Anxiety disorder, unspecified: Secondary | ICD-10-CM | POA: Diagnosis not present

## 2023-02-15 NOTE — Progress Notes (Signed)
[facility] Counselor[facility]Therapist Progress Note   Patient ID: Cindy Woods, MRN: [id number],     Date: 02/15/2023   Time in 11: 00 a.m. time out 11: 57 AM   Treatment Type: Individual Therapy   Reported Symptoms: sadness, anxiety, irritability   Mental Status Exam:   Appearance:    Well Groomed     Behavior:   Appropriate  Motor:   Normal  Speech/Language:    Normal Rate  Affect:   Appropriate  Mood:   anxious  Thought process:   normal  Thought content:     WNL  Sensory/Perceptual disturbances:     WNL  Orientation:   oriented to person, place, time/date, and situation  Attention:   Good  Concentration:   Good  Memory:   WNL  Fund of knowledge:    Good  Insight:     Good  Judgment:    Good  Impulse Control:   Good    Risk Assessment: Danger to Self:  No Self-injurious Behavior: No Danger to Others: No Duty to Warn:no Physical Aggression / Violence:No  Access to Firearms a concern: No  Gang Involvement:No    Subjective: Patient presents for session on time.  Patient was seen in therapy previously with Stevphen Meuse, [facility].  Facilitated patient identifying needs where she stated she wants to be "kinder" to others questions if she is becoming more like her father over the years.  She struggles with low motivation, reports she feels she watches too much TV and has no social life.  She is single and she would like to have more friends.  She reports a history of depression sometimes she feels it can be severe and she wants to improve coping skills.  She has a tendency to struggle with running late to work, anxiety about how she can be perceived at work.  Reports feelings of insecurity.  She reports her mother passed away in 04-01-2000 due to an aneurysm, her father is alive, age 74.  She reports having 3 siblings and older brother and a younger sister.  She reports there are challenges with family relationships.  She reports having moments of regret, she wants to be more mature  she has a tendency to seek others approval.  Reports her anxiety typically increases at night and also copes with misophonia.  Provide support and understanding throughout, facilitating her identifying needs and clarifying feelings.   Interventions: Supportive therapy, motivational interviewing   Diagnosis:     ICD-10-CM    1. Episodic mood disorder (HCC)  F39           Plan: Patient to utilize coping skills as identified in session.    Long-term goal: Develop a consistent positive self image Short-term goal: Identify positive things about self     Stevphen Meuse, [facility]

## 2023-03-07 ENCOUNTER — Encounter: Payer: Self-pay | Admitting: Psychiatry

## 2023-03-08 ENCOUNTER — Other Ambulatory Visit (HOSPITAL_COMMUNITY): Payer: Self-pay

## 2023-03-20 ENCOUNTER — Ambulatory Visit: Payer: 59 | Admitting: Mental Health

## 2023-03-20 DIAGNOSIS — F419 Anxiety disorder, unspecified: Secondary | ICD-10-CM | POA: Diagnosis not present

## 2023-03-20 NOTE — Progress Notes (Signed)
[facility] Counselor[facility]Therapist Progress Note   Patient ID: Cindy Woods, MRN: [id number],     Date: 03/20/2023   Time in 3: 00 pm time out 3:51 pm   Treatment Type: Individual Therapy   Reported Symptoms: sadness, anxiety, irritability   Mental Status Exam:   Appearance:    Well Groomed     Behavior:   Appropriate  Motor:   Normal  Speech/Language:    Normal Rate  Affect:   Appropriate  Mood:   anxious  Thought process:   normal  Thought content:     WNL  Sensory/Perceptual disturbances:     WNL  Orientation:   oriented to person, place, time/date, and situation  Attention:   Good  Concentration:   Good  Memory:   WNL  Fund of knowledge:    Good  Insight:     Good  Judgment:    Good  Impulse Control:   Good    Risk Assessment: Danger to Self:  No Self-injurious Behavior: No Danger to Others: No Duty to Warn:no Physical Aggression / Violence:No  Access to Firearms a concern: No  Gang Involvement:No    Subjective: Patient presents for session on time.  Assessed recent events, progress.  Patient shared how she has been more stressed recently, work related.  She is a Engineer, civil (consulting) at [employer], covering 3 different schools, one recently added which has added to her stress levels.  She stated she has many responsibilities currently to a point to where she questions if she can get caught up.  She identified her struggle with organization.  We explored ways collaboratively that she can improve organization where she plans to follow through with utilizing her calendar daily which she stated she often does not use currently.  She went on to share interactions with others where she feels she struggles with self-confidence.  She often can question her interactions, being self-critical and at times shaming herself.  Some examples were shared in session, working with patient from a cognitive behavioral framework towards reframing some identified thoughts.  Interventions:  Supportive therapy, motivational interviewing   Diagnosis:     ICD-10-CM    1. Episodic mood disorder (HCC)  F39           Plan: Patient to utilize coping skills as identified in session.  Patient to improve organization by using her calendar.  Long-term goal: Develop a consistent positive self image Short-term goal: Identify positive things about self     Cindy Woods, [facility]

## 2023-03-26 ENCOUNTER — Ambulatory Visit: Payer: 59 | Admitting: Skilled Nursing Facility1

## 2023-03-27 ENCOUNTER — Other Ambulatory Visit (HOSPITAL_COMMUNITY): Payer: Self-pay

## 2023-03-27 MED ORDER — PROGESTERONE 200 MG PO CAPS
200.0000 mg | ORAL_CAPSULE | Freq: Every evening | ORAL | 6 refills | Status: DC
  Filled 2023-03-27: qty 12, 30d supply, fill #0
  Filled 2023-04-06: qty 36, 30d supply, fill #0

## 2023-04-03 ENCOUNTER — Ambulatory Visit: Payer: 59 | Admitting: Mental Health

## 2023-04-03 DIAGNOSIS — F419 Anxiety disorder, unspecified: Secondary | ICD-10-CM

## 2023-04-03 NOTE — Progress Notes (Signed)
[facility] Counselor[facility]Therapist Progress Note   Patient ID: Cindy Woods, MRN: [id number],     Date: 04/03/2023   Time in 3: 00 pm time out 3:52 pm   Treatment Type: Individual Therapy   Reported Symptoms: sadness, anxiety, irritability   Mental Status Exam:   Appearance:    Well Groomed     Behavior:   Appropriate  Motor:   Normal  Speech/Language:    Normal Rate  Affect:   Appropriate  Mood:   anxious  Thought process:   normal  Thought content:     WNL  Sensory/Perceptual disturbances:     WNL  Orientation:   oriented to person, place, time/date, and situation  Attention:   Good  Concentration:   Good  Memory:   WNL  Fund of knowledge:    Good  Insight:     Good  Judgment:    Good  Impulse Control:   Good    Risk Assessment: Danger to Self:  No Self-injurious Behavior: No Danger to Others: No Duty to Warn:no Physical Aggression / Violence:No  Access to Firearms a concern: No  Gang Involvement:No    Subjective: Patient presents for session on time.  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  She stated that she may have had a panic attack last week after having a very busy day at her job.  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  She stated that she was told that probably was a panic attack.  She went on to share how she has had some difficult days when school started, being given many responsibilities, she is trying to utilize staff to assist however, it has been challenging.  Through further guided discovery, she continued to identify her tendency to be hard on herself, self-critical.  She stated that her administrator spoke to her today and she questions how she responded to, sharing her tendency to ruminate over these types of situations with her tendency to be self-critical.  Patient was encouraged to recognize her balancing many  different roles in her job, her ability to also manage a situation today that involved a parent who escalated.  Explored self-care where she plans to follow through with focusing on getting enough rest going to bed early enough.  Continue to work with patient from a cognitive behavioral framework.   Interventions: Supportive therapy, motivational interviewing   Diagnosis:     ICD-10-CM    1. Episodic mood disorder (HCC)  F39           Plan: Patient to utilize coping skills as identified in session.  Patient to improve organization by using her calendar.  Long-term goal: Develop a consistent positive self image Short-term goal: Identify positive things about self     Stevphen Meuse, [facility]

## 2023-04-04 ENCOUNTER — Ambulatory Visit: Payer: 59 | Admitting: Skilled Nursing Facility1

## 2023-04-05 ENCOUNTER — Other Ambulatory Visit (HOSPITAL_COMMUNITY): Payer: Self-pay

## 2023-04-06 ENCOUNTER — Other Ambulatory Visit (HOSPITAL_COMMUNITY): Payer: Self-pay

## 2023-04-10 ENCOUNTER — Ambulatory Visit: Payer: 59 | Admitting: Psychiatry

## 2023-04-15 ENCOUNTER — Other Ambulatory Visit: Payer: Self-pay | Admitting: Obstetrics and Gynecology

## 2023-04-15 DIAGNOSIS — R928 Other abnormal and inconclusive findings on diagnostic imaging of breast: Secondary | ICD-10-CM

## 2023-04-16 ENCOUNTER — Ambulatory Visit: Payer: 59 | Admitting: Mental Health

## 2023-04-18 ENCOUNTER — Other Ambulatory Visit: Payer: Self-pay | Admitting: Obstetrics and Gynecology

## 2023-04-18 ENCOUNTER — Ambulatory Visit
Admission: RE | Admit: 2023-04-18 | Discharge: 2023-04-18 | Disposition: A | Discharge status: Home / Self Care | Payer: 59 | Source: Ambulatory Visit | Attending: Obstetrics and Gynecology | Admitting: Obstetrics and Gynecology

## 2023-04-18 DIAGNOSIS — R928 Other abnormal and inconclusive findings on diagnostic imaging of breast: Secondary | ICD-10-CM

## 2023-04-19 ENCOUNTER — Other Ambulatory Visit: Payer: Self-pay | Admitting: Obstetrics and Gynecology

## 2023-04-19 DIAGNOSIS — R921 Mammographic calcification found on diagnostic imaging of breast: Secondary | ICD-10-CM

## 2023-04-30 ENCOUNTER — Ambulatory Visit: Payer: 59 | Admitting: Mental Health

## 2023-05-08 ENCOUNTER — Other Ambulatory Visit (HOSPITAL_COMMUNITY): Payer: Self-pay

## 2023-05-08 ENCOUNTER — Other Ambulatory Visit: Payer: Self-pay

## 2023-05-10 ENCOUNTER — Other Ambulatory Visit (HOSPITAL_COMMUNITY): Payer: Self-pay

## 2023-05-14 ENCOUNTER — Ambulatory Visit: Payer: 59 | Admitting: Mental Health

## 2023-05-29 ENCOUNTER — Ambulatory Visit: Payer: 59 | Admitting: Mental Health

## 2023-06-02 IMAGING — MG MM DIGITAL SCREENING BILAT W/ TOMO AND CAD
8 series · 9 of 24 positions shown · non-contrast
Comparison: Previous exam(s).

CLINICAL DATA: Screening.

EXAM:
DIGITAL SCREENING BILATERAL MAMMOGRAM WITH TOMOSYNTHESIS AND CAD
TECHNIQUE: Bilateral screening digital craniocaudal and mediolateral oblique
mammograms were obtained. Bilateral screening digital breast
tomosynthesis was performed. The images were evaluated with
computer-aided detection.

[L CC synth-2D]
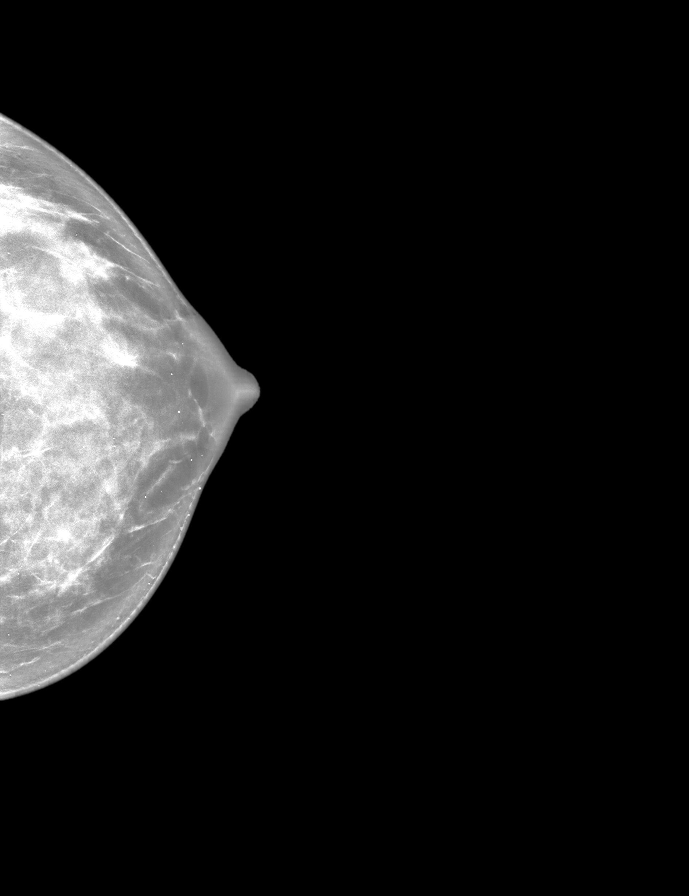

[R MLO synth-2D]
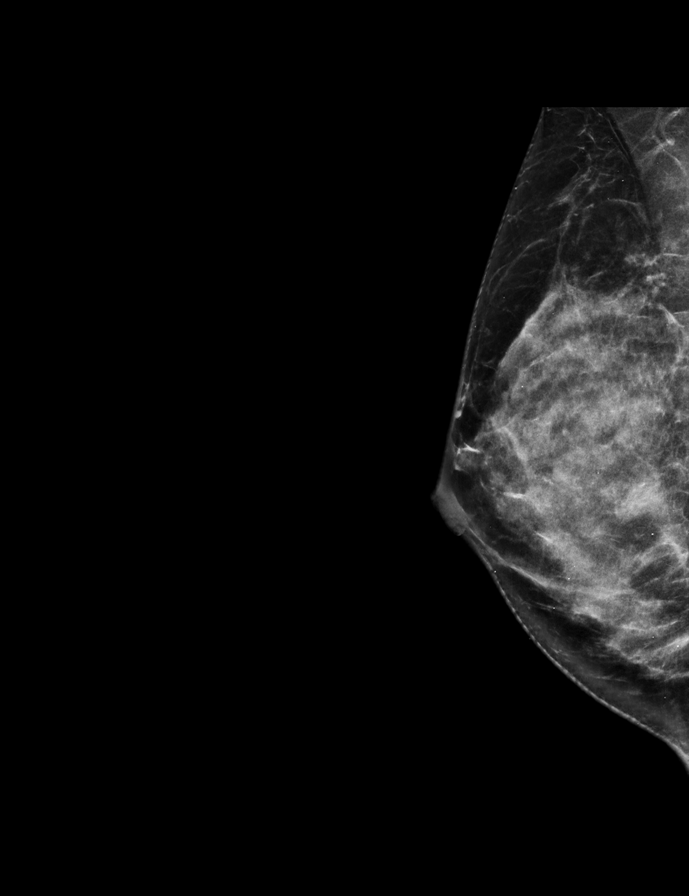

[L MLO synth-2D]
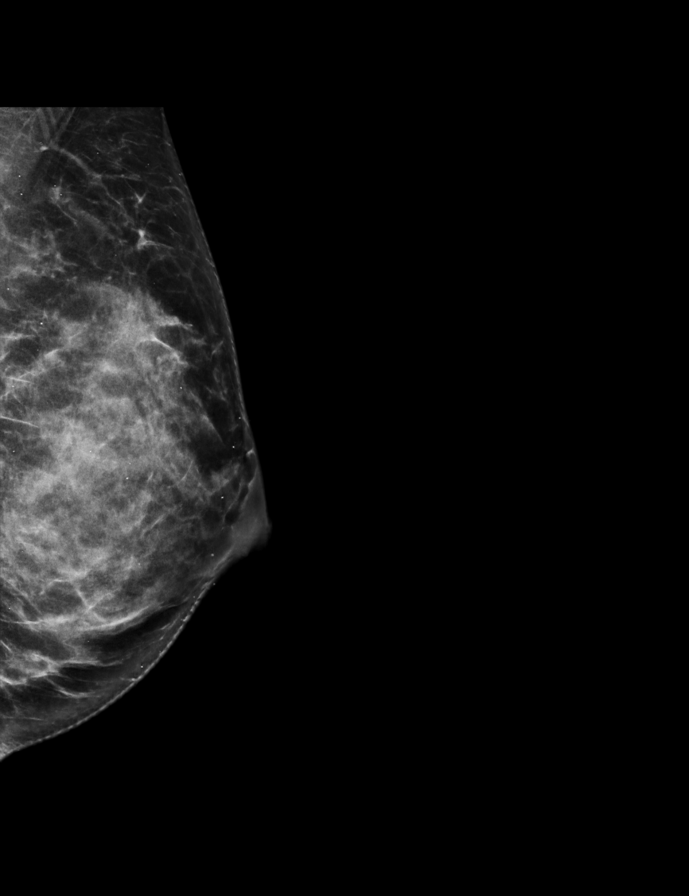

[R CC synth-2D]
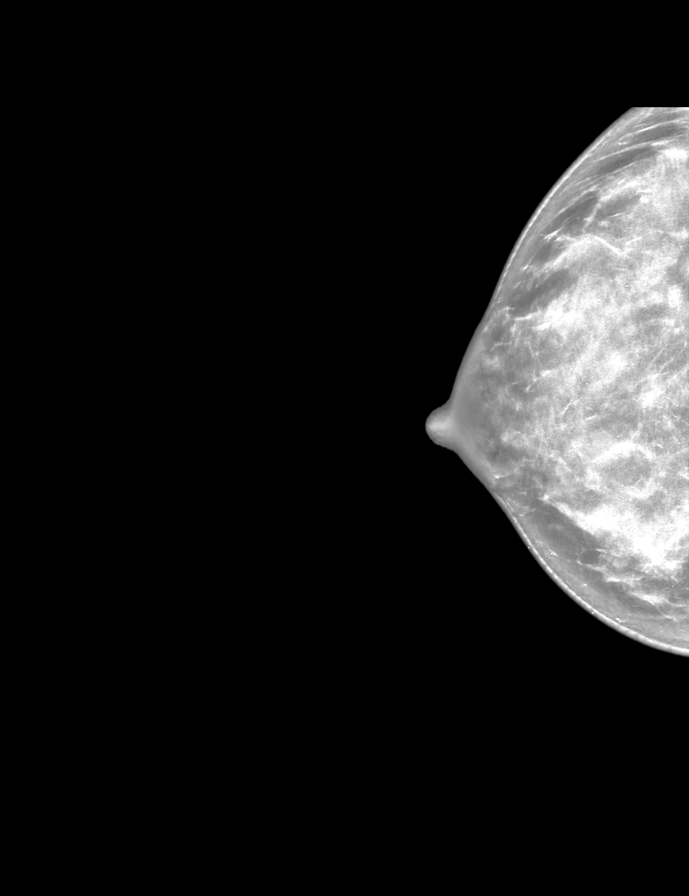

[R CC tomo · 2 of 65 frames shown]
[frame 21/65]
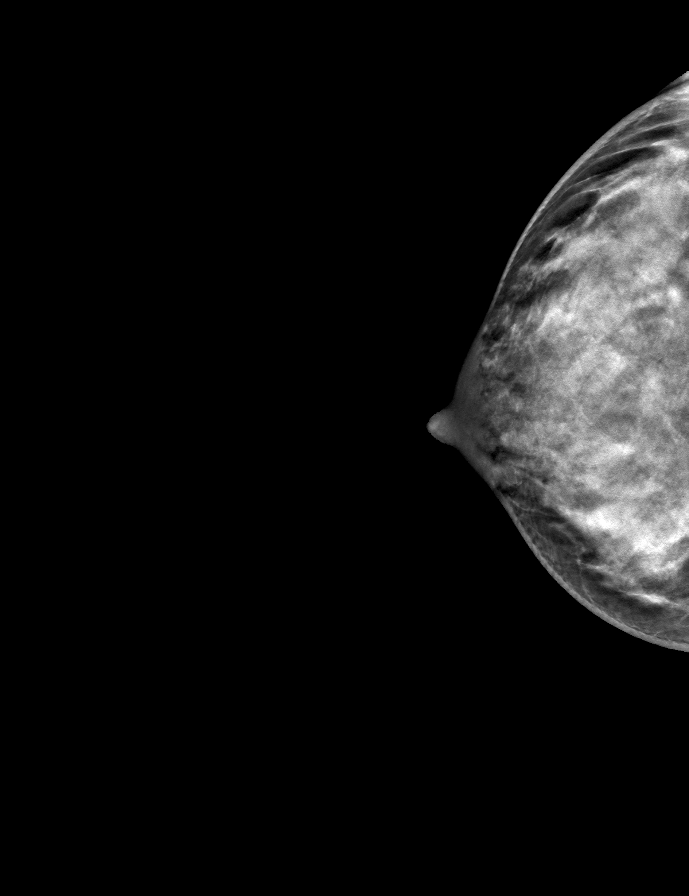
[frame 33/65]
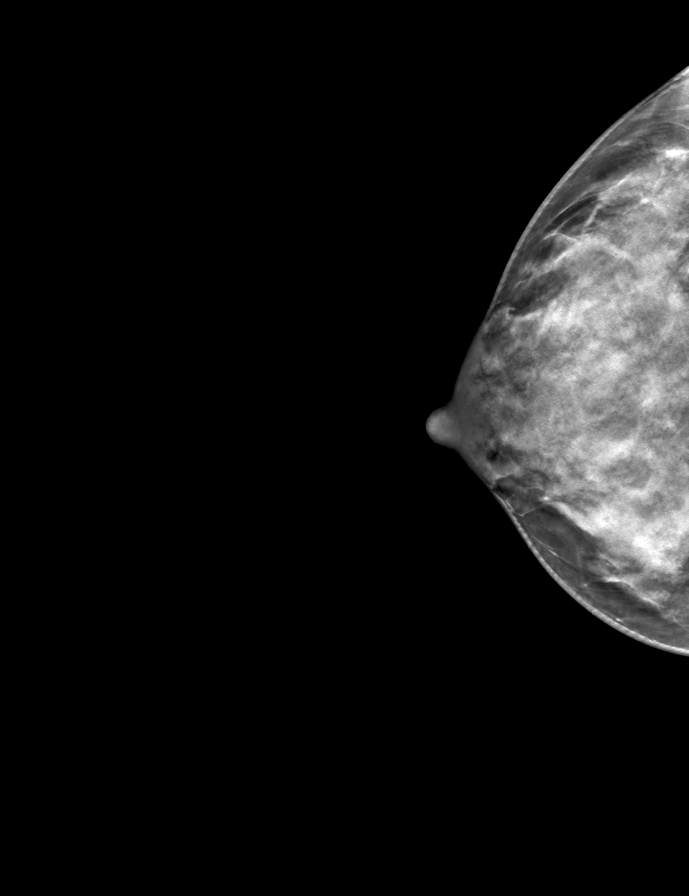

[L CC tomo · tomo slice 35/70.0]
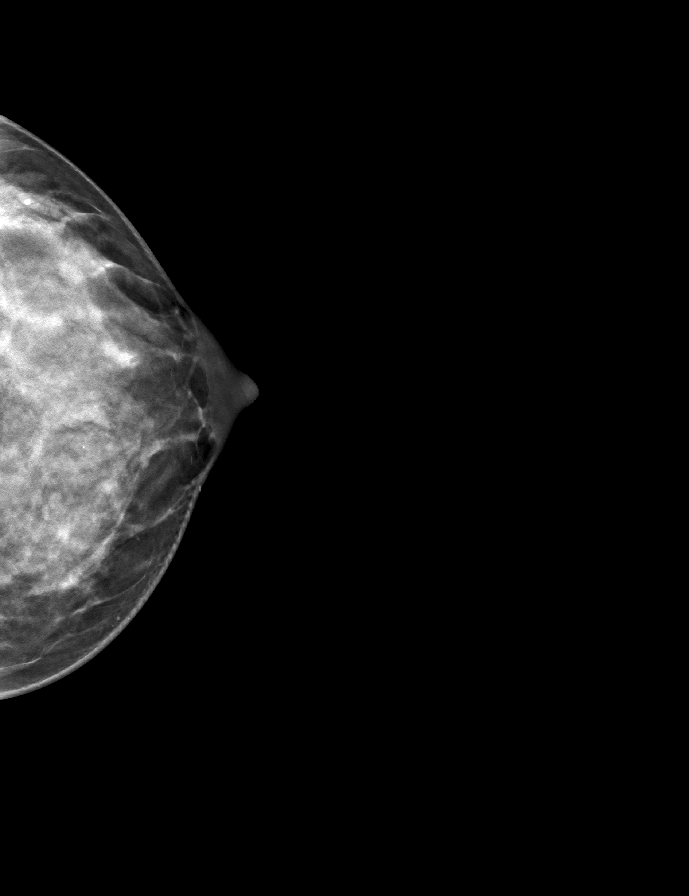

[R MLO tomo · tomo slice 31/62.0]
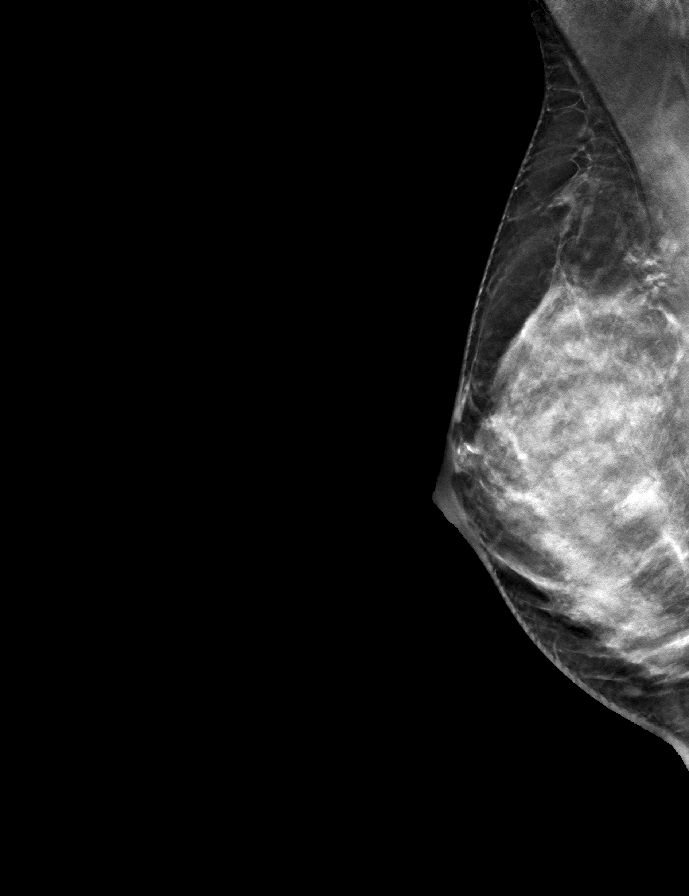

[L MLO tomo · tomo slice 31/62.0]
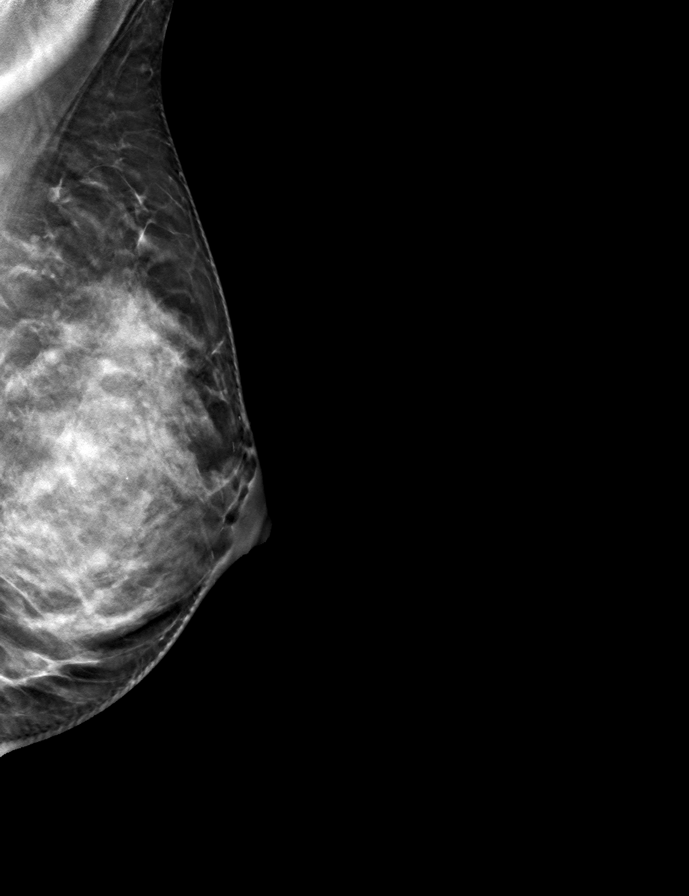

[9 of 24 positions shown; findings below may reference images not displayed]

ACR Breast Density Category d: The breast tissue is extremely dense,
which lowers the sensitivity of mammography
FINDINGS: There are no findings suspicious for malignancy.
IMPRESSION: No mammographic evidence of malignancy. A result letter of this
screening mammogram will be mailed directly to the patient.

RECOMMENDATION:
Screening mammogram in one year. (Code:[id number]-[id number])

BI-RADS CATEGORY  1: Negative.

## 2023-06-06 ENCOUNTER — Other Ambulatory Visit (HOSPITAL_COMMUNITY): Payer: Self-pay

## 2023-06-06 ENCOUNTER — Other Ambulatory Visit: Payer: Self-pay

## 2023-06-07 ENCOUNTER — Other Ambulatory Visit (HOSPITAL_COMMUNITY): Payer: Self-pay

## 2023-06-13 ENCOUNTER — Other Ambulatory Visit: Payer: Self-pay

## 2023-06-13 ENCOUNTER — Telehealth: Payer: Self-pay | Admitting: Psychiatry

## 2023-06-13 ENCOUNTER — Other Ambulatory Visit (HOSPITAL_COMMUNITY): Payer: Self-pay

## 2023-06-13 DIAGNOSIS — F419 Anxiety disorder, unspecified: Secondary | ICD-10-CM

## 2023-06-13 DIAGNOSIS — F5101 Primary insomnia: Secondary | ICD-10-CM

## 2023-06-13 MED ORDER — ALPRAZOLAM 0.25 MG PO TABS
0.2500 mg | ORAL_TABLET | Freq: Every day | ORAL | 0 refills | Status: DC
  Filled 2023-06-13 (×2): qty 5, 2d supply, fill #0

## 2023-06-13 NOTE — Telephone Encounter (Signed)
T called at 2:07p.  She said she normally gets her refill of Xanax through the mail.  She is not expecting to get it before Tues.  She is out of meds and wants to know if Shanda Bumps will send in enough for her till then.  It's for sleep.  Send to [facility].  Next appt 12/18

## 2023-06-13 NOTE — Telephone Encounter (Signed)
Pended.

## 2023-06-19 ENCOUNTER — Encounter: Payer: Self-pay | Admitting: Psychiatry

## 2023-07-06 ENCOUNTER — Other Ambulatory Visit (HOSPITAL_COMMUNITY): Payer: Self-pay

## 2023-07-08 ENCOUNTER — Telehealth: Payer: Self-pay | Admitting: Psychiatry

## 2023-07-08 DIAGNOSIS — F419 Anxiety disorder, unspecified: Secondary | ICD-10-CM

## 2023-07-08 DIAGNOSIS — F5101 Primary insomnia: Secondary | ICD-10-CM

## 2023-07-08 MED ORDER — ILOPERIDONE 1 MG PO TABS: 1.0000 mg | ORAL_TABLET | Freq: Every day | ORAL | 1 refills | Status: DC

## 2023-07-08 NOTE — Telephone Encounter (Signed)
Next visit is 07/24/23. Requesting refill on ilioperidone 1 mg called to:  [facility] - [place] [place] - [address]   Phone: [phone]  Fax: [phone]

## 2023-07-08 NOTE — Telephone Encounter (Signed)
Rx for 1 mg Fanapt sent to [facility]

## 2023-07-09 ENCOUNTER — Other Ambulatory Visit: Payer: Self-pay | Admitting: Psychiatry

## 2023-07-09 ENCOUNTER — Other Ambulatory Visit (HOSPITAL_COMMUNITY): Payer: Self-pay

## 2023-07-09 DIAGNOSIS — F39 Unspecified mood [affective] disorder: Secondary | ICD-10-CM

## 2023-07-09 MED ORDER — DIVALPROEX SODIUM ER 500 MG PO TB24
1000.0000 mg | ORAL_TABLET | Freq: Every day | ORAL | 1 refills | Status: DC
  Filled 2023-07-09: qty 180, 90d supply, fill #0
  Filled 2023-07-10: qty 14, 7d supply, fill #0

## 2023-07-10 ENCOUNTER — Other Ambulatory Visit (HOSPITAL_COMMUNITY): Payer: Self-pay

## 2023-07-10 ENCOUNTER — Other Ambulatory Visit: Payer: Self-pay

## 2023-07-10 MED ORDER — PROGESTERONE MICRONIZED 100 MG PO CAPS
100.0000 mg | ORAL_CAPSULE | Freq: Every day | ORAL | 5 refills | Status: AC
  Filled 2023-07-10: qty 31, 31d supply, fill #0
  Filled 2023-07-10: qty 12, 28d supply, fill #0
  Filled 2023-08-05: qty 12, 28d supply, fill #1
  Filled 2023-09-04: qty 12, 28d supply, fill #2

## 2023-07-24 ENCOUNTER — Other Ambulatory Visit (HOSPITAL_COMMUNITY): Payer: Self-pay

## 2023-07-24 ENCOUNTER — Encounter: Payer: Self-pay | Admitting: Psychiatry

## 2023-07-24 ENCOUNTER — Ambulatory Visit (INDEPENDENT_AMBULATORY_CARE_PROVIDER_SITE_OTHER): Payer: 59 | Admitting: Psychiatry

## 2023-07-24 DIAGNOSIS — F419 Anxiety disorder, unspecified: Secondary | ICD-10-CM | POA: Diagnosis not present

## 2023-07-24 DIAGNOSIS — F5101 Primary insomnia: Secondary | ICD-10-CM | POA: Diagnosis not present

## 2023-07-24 DIAGNOSIS — F39 Unspecified mood [affective] disorder: Secondary | ICD-10-CM | POA: Diagnosis not present

## 2023-07-24 DIAGNOSIS — Z79899 Other long term (current) drug therapy: Secondary | ICD-10-CM | POA: Diagnosis not present

## 2023-07-24 MED ORDER — SUBVENITE 100 MG PO TABS
100.0000 mg | ORAL_TABLET | Freq: Every day | ORAL | 2 refills | Status: DC
  Filled 2023-07-24: qty 90, 90d supply, fill #0
  Filled 2023-08-05: qty 30, 30d supply, fill #0
  Filled 2023-08-05: qty 10, 10d supply, fill #0
  Filled 2023-08-06: qty 20, 20d supply, fill #0
  Filled 2023-09-04: qty 30, 30d supply, fill #1
  Filled 2023-10-14: qty 30, 30d supply, fill #2
  Filled 2023-11-14: qty 30, 30d supply, fill #3
  Filled 2023-12-13 (×2): qty 30, 30d supply, fill #4
  Filled 2024-01-14: qty 30, 30d supply, fill #5

## 2023-07-24 MED ORDER — DIVALPROEX SODIUM ER 500 MG PO TB24
1000.0000 mg | ORAL_TABLET | Freq: Every day | ORAL | 1 refills | Status: DC
  Filled 2023-07-24 – 2023-09-11 (×2): qty 180, 90d supply, fill #0
  Filled 2023-10-02: qty 60, 30d supply, fill #0

## 2023-07-24 MED ORDER — ILOPERIDONE 1 MG PO TABS
1.0000 mg | ORAL_TABLET | Freq: Every day | ORAL | 1 refills | Status: DC
Start: 2023-07-24 — End: 2023-12-15

## 2023-07-24 MED ORDER — ALPRAZOLAM 0.25 MG PO TABS: 0.2500 mg | ORAL_TABLET | Freq: Every day | ORAL | 5 refills | Status: DC

## 2023-07-24 NOTE — Progress Notes (Signed)
Cindy Woods [id number] 07-05-1976 47 y.o.  Subjective:   [name] is a 46 y.o. (DOB 1975-12-24) female.  Chief Complaint:  Chief Complaint  Patient presents with   Follow-up    Anxiety, mood disturbance, and insomnia    HPI Cindy Woods presents to the office today for follow-up of anxiety, insomnia, and mood disturbance. She reports that her mood has been "good." She feels that depression has improved with decrease in progesterone. Anxiety has been "fine." Reports irritability has been "fine." Reports sleeping well with medication and averages 7-8 hours a night. Energy and motivation have been ok. Concentration has been ok. Appetite has been good. Denies SI.   Past Psychiatric Medication Trials: Seroquel Viibryd Wellbutrin Klonopin Latuda Abilify Fanapt- Initially helpful at 1 mg Lithium Zoloft-Adverse effects Effexor- May have been helpful Celexa Paxil Prozac BuSpar Lexapro Nuvigil Sonata- Was no longer effective and then was having to take more. Reports that she has had some residual grogginess the following day.  Xanax Rexulti-restlessness Ativan Hydroxyzine Trintellix Temazepam Mirtazapine- Excessive daytime somnolence.  Lamictal- anxious side effect.  Subvenite has been more effective and better tolerated Subvenite- more effective and better tolerated compared to generic Depakote Trazodone Dayvigo- Minimally effective and excessive grogginess Gabapentin- excessive somnolence  AIMS    Flowsheet Row Office Visit from 01/22/2023 in [facility] Office Visit from 08/13/2022 in [facility] Office Visit from 03/02/2021 in [facility] Office Visit from 04/26/2020 in [facility]  AIMS Total Score 0 0 0 0      GAD-7    Flowsheet Row Office Visit from 02/01/2022 in [facility]  Total GAD-7 Score 2      PHQ2-9    Flowsheet Row Office Visit from 02/01/2022 in [facility] Office Visit from 01/26/2021 in [facility] [place] Office Visit from 01/13/2018 in [place]  PHQ-2 Total Score 2 1 0  PHQ-9 Total Score 5 8 0        Review of Systems:  Review of Systems  HENT:  Positive for congestion and sore throat.   Musculoskeletal:  Negative for gait problem.  Psychiatric/Behavioral:         Please refer to HPI    Medications: I have reviewed the patient's current medications.  Current Outpatient Medications  Medication Sig Dispense Refill   ferrous sulfate 325 (65 FE) MG tablet Take 325 mg by mouth daily with breakfast.     Melatonin 1 MG CAPS Take by mouth.     progesterone (PROMETRIUM) 100 MG capsule Take 1 capsule (100 mg total) by mouth daily for 12 days of each calendar month as directed. 36 capsule 5   [START ON 08/05/2023] ALPRAZolam (XANAX) 0.25 MG tablet Take 1-2 tablets (0.25-0.5 mg total) by mouth at bedtime. 60 tablet 5   divalproex (DEPAKOTE ER) 500 MG 24 hr tablet Take 2 tablets (1,000 mg total) by mouth daily. 180 tablet 1   fluticasone (CUTIVATE) 0.05 % cream Apply to affected area twice daily for 1 week, then take 1 week off. Repeat as needed for flare. 30 g 2   Iloperidone 1 MG TABS Take 1 tablet (1 mg total) by mouth at bedtime. 90 tablet 1   liothyronine (CYTOMEL) 5 MCG tablet Take 1 tablet (5 mcg total) by mouth every morning before meal (  Patient not taking: Reported on 07/24/2023) 90 tablet 2   progesterone (PROMETRIUM) 200 MG capsule Take 1 capsule (200 mg total) by mouth at bedtime for the first 12 days of each month (Patient not taking: Reported on 07/24/2023) 36 capsule 6   SUBVENITE 100 MG tablet Take 1 tablet (100 mg total) by mouth daily. 90 tablet 2   No current facility-administered medications for this visit.    Medication  Side Effects: None  Allergies: No Known Allergies  Past Medical History:  Diagnosis Date   Bipolar II disorder (HCC) 06/15/2018    Past Medical History, Surgical history, Social history, and Family history were reviewed and updated as appropriate.   Please see review of systems for further details on the patient's review from today.   Objective:   Physical Exam:  There were no vitals taken for this visit.  Physical Exam Constitutional:      General: She is not in acute distress. Musculoskeletal:        General: No deformity.  Neurological:     Mental Status: She is alert and oriented to person, place, and time.     Coordination: Coordination normal.  Psychiatric:        Attention and Perception: Attention and perception normal. She does not perceive auditory or visual hallucinations.        Mood and Affect: Mood normal. Mood is not anxious or depressed. Affect is not labile, blunt, angry or inappropriate.        Speech: Speech normal.        Behavior: Behavior normal.        Thought Content: Thought content normal. Thought content is not paranoid or delusional. Thought content does not include homicidal or suicidal ideation. Thought content does not include homicidal or suicidal plan.        Cognition and Memory: Cognition and memory normal.        Judgment: Judgment normal.     Comments: Insight intact     Lab Review:     Component Value Date/Time   NA 141 06/19/2021 1611   K 4.3 06/19/2021 1611   CL 103 06/19/2021 1611   CO2 24 06/19/2021 1611   GLUCOSE 106 (H) 06/19/2021 1611   BUN 12 06/19/2021 1611   CREATININE 0.77 06/19/2021 1611   CALCIUM 9.0 06/19/2021 1611   PROT 6.6 06/19/2021 1611   ALBUMIN 4.3 06/19/2021 1611   AST 10 06/19/2021 1611   ALT 8 06/19/2021 1611   ALKPHOS 50 06/19/2021 1611   BILITOT <0.2 06/19/2021 1611   GFRNONAA 82 07/16/2018 1324   GFRAA 95 07/16/2018 1324       Component Value Date/Time   WBC 5.7 07/16/2018 1324   RBC 4.10  07/16/2018 1324   HGB 13.1 07/16/2018 1324   HCT 38.8 07/16/2018 1324   PLT 186 07/16/2018 1324   MCV 95 07/16/2018 1324   MCH 32.0 07/16/2018 1324   MCHC 33.8 07/16/2018 1324   RDW 11.8 (L) 07/16/2018 1324   LYMPHSABS 2.0 07/16/2018 1324   EOSABS 0.2 07/16/2018 1324   BASOSABS 0.0 07/16/2018 1324    No results found for: "POCLITH", "LITHIUM"   Lab Results  Component Value Date   VALPROATE 65 06/19/2021     .res Assessment: Plan:    27 minutes spent dedicated to the care of this patient on the date of this encounter to include pre-visit review of records, ordering of medication, post visit documentation, and face-to-face time with the patient discussing lab results  and potential adverse effects of Depakote. Recommend re-checking CBC since platelets have been trending lower over the past few months. Discussed considering dose reduction in Depakote and/or referral to hematology if platelet count continues to decrease. Will repeat Iron panel since iron levels have been low in the past.  Will continue Depakote ER 1000 mg at bedtime for mood symptoms.  Continue Subvenite 100 mg daily for mood symptoms.  Continue Fanapt 1 mg po at bedtime for anxiety, mood symptoms, and insomnia.  Continue Alprazolam 0.25 mg 1-2 tablets at bedtime for anxiety and insomnia.  Discussed transfer of care to Avelina Laine, NP since this provider will be leaving.  Pt to follow-up in 6 months or sooner if clinically indicated.  Patient advised to contact office with any questions, adverse effects, or acute worsening in signs and symptoms.    Cindy Woods was seen today for follow-up.  Diagnoses and all orders for this visit:  Anxiety disorder, unspecified type -     Iloperidone 1 MG TABS; Take 1 tablet (1 mg total) by mouth at bedtime. -     ALPRAZolam (XANAX) 0.25 MG tablet; Take 1-2 tablets (0.25-0.5 mg total) by mouth at bedtime.  High risk medication use -     CBC with Differential/Platelet -     Iron, TIBC  and Ferritin Panel -     Valproic acid level  Episodic mood disorder (HCC) -     divalproex (DEPAKOTE ER) 500 MG 24 hr tablet; Take 2 tablets (1,000 mg total) by mouth daily. -     SUBVENITE 100 MG tablet; Take 1 tablet (100 mg total) by mouth daily.  Primary insomnia -     Iloperidone 1 MG TABS; Take 1 tablet (1 mg total) by mouth at bedtime. -     ALPRAZolam (XANAX) 0.25 MG tablet; Take 1-2 tablets (0.25-0.5 mg total) by mouth at bedtime.     Please see After Visit Summary for patient specific instructions.  Future Appointments  Date Time Provider Department Center  01/22/2024  8:30 AM Joan Flores, NP [facility] None    Orders Placed This Encounter  Procedures   CBC with Differential/Platelet   Iron, TIBC and Ferritin Panel   Valproic acid level    -------------------------------

## 2023-08-05 ENCOUNTER — Other Ambulatory Visit (HOSPITAL_COMMUNITY): Payer: Self-pay

## 2023-08-05 ENCOUNTER — Other Ambulatory Visit: Payer: Self-pay

## 2023-08-06 ENCOUNTER — Other Ambulatory Visit: Payer: Self-pay

## 2023-08-06 ENCOUNTER — Other Ambulatory Visit (HOSPITAL_COMMUNITY): Payer: Self-pay

## 2023-08-09 ENCOUNTER — Other Ambulatory Visit (HOSPITAL_COMMUNITY): Payer: Self-pay

## 2023-08-21 ENCOUNTER — Encounter: Payer: Self-pay | Admitting: Behavioral Health

## 2023-09-04 ENCOUNTER — Other Ambulatory Visit (HOSPITAL_COMMUNITY): Payer: Self-pay

## 2023-09-05 ENCOUNTER — Other Ambulatory Visit: Payer: Self-pay

## 2023-09-11 ENCOUNTER — Other Ambulatory Visit (HOSPITAL_COMMUNITY): Payer: Self-pay

## 2023-09-20 ENCOUNTER — Encounter: Payer: Self-pay | Admitting: Behavioral Health

## 2023-09-20 ENCOUNTER — Ambulatory Visit: Payer: 59 | Admitting: Behavioral Health

## 2023-09-20 DIAGNOSIS — F39 Unspecified mood [affective] disorder: Secondary | ICD-10-CM

## 2023-09-20 DIAGNOSIS — Z79899 Other long term (current) drug therapy: Secondary | ICD-10-CM

## 2023-09-20 DIAGNOSIS — F419 Anxiety disorder, unspecified: Secondary | ICD-10-CM

## 2023-09-20 NOTE — Progress Notes (Signed)
[facility]  Patient ID: Cindy Woods,  MRN: [id number]  PCP: Pcp, No  Date of Evaluation: 09/20/2023 Time spent:20 minutes  Chief Complaint:  Chief Complaint   Anxiety; Depression; Follow-up; Patient Education; Medication Problem     HISTORY/CURRENT STATUS: HPI Arwilda Xia Stohr presents to the office today for follow-up of anxiety, insomnia, and mood disturbance. She is here today because she wanted to discuss her labs that was previously ordered by Corie Chiquito in January. Her concerns were how Depakote may be elevating her Iron levels.  She reports that her mood has been "good." She feels that depression has improved with decrease in progesterone. Anxiety has been "fine." Reports irritability has been "fine." Reports sleeping well with medication and averages 7-8 hours a night. Energy and motivation have been ok. Concentration has been ok.  Denies mania, no psychosis, no auditory or visual hallucinations. Appetite has been good. Denies SI. Request f/u in 6 months.    Past Psychiatric Medication Trials: Seroquel Viibryd Wellbutrin Klonopin Latuda Abilify Fanapt- Initially helpful at 1 mg Lithium Zoloft-Adverse effects Effexor- May have been helpful Celexa Paxil Prozac BuSpar Lexapro Nuvigil Sonata- Was no longer effective and then was having to take more. Reports that she has had some residual grogginess the following day.  Xanax Rexulti-restlessness Ativan Hydroxyzine Trintellix Temazepam Mirtazapine- Excessive daytime somnolence.  Lamictal- anxious side effect.  Subvenite has been more effective and better tolerated Subvenite- more effective and better tolerated compared to generic Depakote Trazodone Dayvigo- Minimally effective and excessive grogginess Gabapentin- excessive somnolence Individual Medical History/ Review of Systems: Changes? :No   Allergies: Patient has no known allergies.  Current Medications:  Current  Outpatient Medications:    ALPRAZolam (XANAX) 0.25 MG tablet, Take 1-2 tablets (0.25-0.5 mg total) by mouth at bedtime., Disp: 60 tablet, Rfl: 5   divalproex (DEPAKOTE ER) 500 MG 24 hr tablet, Take 2 tablets (1,000 mg total) by mouth daily., Disp: 180 tablet, Rfl: 1   ferrous sulfate 325 (65 FE) MG tablet, Take 325 mg by mouth daily with breakfast., Disp: , Rfl:    fluticasone (CUTIVATE) 0.05 % cream, Apply to affected area twice daily for 1 week, then take 1 week off. Repeat as needed for flare., Disp: 30 g, Rfl: 2   Iloperidone 1 MG TABS, Take 1 tablet (1 mg total) by mouth at bedtime., Disp: 90 tablet, Rfl: 1   liothyronine (CYTOMEL) 5 MCG tablet, Take 1 tablet (5 mcg total) by mouth every morning before meal (Patient not taking: Reported on 07/24/2023), Disp: 90 tablet, Rfl: 2   Melatonin 1 MG CAPS, Take by mouth., Disp: , Rfl:    progesterone (PROMETRIUM) 100 MG capsule, Take 1 capsule (100 mg total) by mouth daily for 12 days of each calendar month as directed., Disp: 36 capsule, Rfl: 5   progesterone (PROMETRIUM) 200 MG capsule, Take 1 capsule (200 mg total) by mouth at bedtime for the first 12 days of each month (Patient not taking: Reported on 07/24/2023), Disp: 36 capsule, Rfl: 6   SUBVENITE 100 MG tablet, Take 1 tablet (100 mg total) by mouth daily., Disp: 90 tablet, Rfl: 2 Medication Side Effects: none  Family Medical/ Social History: Changes? No  MENTAL HEALTH EXAM:  There were no vitals taken for this visit.There is no height or weight on file to calculate BMI.  General Appearance: Casual  Eye Contact:  Good  Speech:  Clear and Coherent  Volume:  Normal  Mood:  Angry  Affect:  Appropriate  Thought  Process:  Coherent  Orientation:  Full (Time, Place, and Person)  Thought Content: Logical   Suicidal Thoughts:  No  Homicidal Thoughts:  No  Memory:  WNL  Judgement:  Good  Insight:  Good  Psychomotor Activity:  NA  Concentration:  Concentration: Good  Recall:  Good  Fund  of Knowledge: Good  Language: Good  Assets:  Desire for Improvement  ADL's:  Intact  Cognition: WNL  Prognosis:  Good    DIAGNOSES:    ICD-10-CM   1. Anxiety disorder, unspecified type  F41.9     2. High risk medication use  Z79.899     3. Episodic mood disorder (HCC)  F39       Receiving Psychotherapy: No    RECOMMENDATIONS:   20  minutes spent dedicated to the care of this patient on the date of this encounter to include pre-visit review of records, ordering of medication, post visit documentation, and face-to-face time with the patient discussing lab results and potential adverse effects of Depakote.  Corie Chiquito had Recommend re-checking CBC since platelets have been trending lower over the past few months. Discussed considering dose reduction in Depakote and/or referral to hematology if platelet count continues to decrease. Will repeat Iron panel since iron levels have been low in the past. I advised her that she needed to further discuss her iron levels and reduced platelet count with PCP since this is a medical issue. PCP should also provide referral to hematology if necessary. Should recheck Depakote levels next visit.  Pt is searching for [facility] therapist.    Will continue Depakote ER 1000 mg at bedtime for mood symptoms.  Continue Subvenite 100 mg daily for mood symptoms.  Continue Fanapt 1 mg po at bedtime for anxiety, mood symptoms, and insomnia.  Continue Alprazolam 0.25 mg 1-2 tablets at bedtime for anxiety and insomnia.  Pt to follow-up in 6 months or sooner if clinically indicated.  Patient advised to contact office with any questions, adverse effects, or acute worsening in signs and symptoms.      Joan Flores, NP

## 2023-10-02 ENCOUNTER — Other Ambulatory Visit (HOSPITAL_COMMUNITY): Payer: Self-pay

## 2023-10-09 ENCOUNTER — Telehealth: Payer: Self-pay | Admitting: Psychiatry

## 2023-10-09 DIAGNOSIS — F39 Unspecified mood [affective] disorder: Secondary | ICD-10-CM

## 2023-10-09 MED ORDER — DIVALPROEX SODIUM ER 500 MG PO TB24: 1000.0000 mg | ORAL_TABLET | Freq: Every day | ORAL | 1 refills | Status: DC

## 2023-10-09 NOTE — Telephone Encounter (Signed)
 Patient called in for refill on Depakote 500mg . Patient saw JC she is now scheduled with BW 6/18. Ph: [phone] Pharmacy [facility] [address] [place], [place]

## 2023-10-09 NOTE — Telephone Encounter (Signed)
 RF for Depakote 500 mg x 2 sent to [facility].

## 2023-10-14 ENCOUNTER — Other Ambulatory Visit (HOSPITAL_COMMUNITY): Payer: Self-pay

## 2023-10-17 ENCOUNTER — Ambulatory Visit
Admission: RE | Admit: 2023-10-17 | Discharge: 2023-10-17 | Disposition: A | Discharge status: Home / Self Care | Payer: 59 | Source: Ambulatory Visit | Attending: Obstetrics and Gynecology | Admitting: Obstetrics and Gynecology

## 2023-10-17 DIAGNOSIS — R921 Mammographic calcification found on diagnostic imaging of breast: Secondary | ICD-10-CM

## 2023-10-18 ENCOUNTER — Other Ambulatory Visit: Payer: Self-pay | Admitting: Obstetrics and Gynecology

## 2023-10-18 DIAGNOSIS — R921 Mammographic calcification found on diagnostic imaging of breast: Secondary | ICD-10-CM

## 2023-11-14 ENCOUNTER — Other Ambulatory Visit (HOSPITAL_COMMUNITY): Payer: Self-pay

## 2023-11-14 NOTE — Progress Notes (Signed)
 [facility] [phone]   [phone]  INITIAL CONSULT NOTE  Patient Care Team: Pcp, No as PCP - General  Hematological/Oncological History Prior labs from PCP, Dr. Errol Heaps [facility]) 12/25/2022: TIBC 239 (L), iron 80, iron saturation 33%, 02/28/2023: Iron 32 (L), saturation 12% (L), TIBC 265. 08/19/2023: WBC 5.8, Hgb 13.5, MCV 96.3, Plt 147(L), Ferritin 37.6, Iron 58, TIBC 226 (L), saturation 26%. 10/10/2023: TIBC 207 (L), saturation 62% (H), ferritin 70 11/15/2023: Establish care with [facility] Hematology  CHIEF COMPLAINTS/PURPOSE OF CONSULTATION:  Iron deficiency  HISTORY OF PRESENTING ILLNESS:  Cindy Woods 48 y.o. female with medical history significant for bipolar 2 disorder presents to the clinic for evaluation for iron deficiency.  She is unaccompanied for this visit.  On exam today, Mr. Backstrom complains of ongoing fatigue that has been present for quite some time.  She has been on iron supplementation without any toxicities.  She does have some shortness of breath with exertion and occasional episodes of tachycardia.  She has been struggling with cold sensitivity which is chronic in nature.  She denies dizziness or syncopal episodes.  She does not crave ice.  She has no signs of active bleeding including hematochezia, melena or hemoptysis..  She reports having irregular menstrual cycles but does not experience heavy bleeding.  She denies fevers, chills, sweats, chest pain or cough.  She has no other complaints.  Rest of the 10 point ROS as below.  MEDICAL HISTORY:  Past Medical History:  Diagnosis Date   Bipolar II disorder (HCC) 06/15/2018    SURGICAL HISTORY: History reviewed. No pertinent surgical history.  SOCIAL HISTORY: Social History   Socioeconomic History   Marital status: Single    Spouse name: Not on file   Number of children: Not on file   Years of education: Not on file   Highest education level:  Not on file  Occupational History   Not on file  Tobacco Use   Smoking status: Never   Smokeless tobacco: Never  Vaping Use   Vaping status: Never Used  Substance and Sexual Activity   Alcohol use: Yes    Comment: Rare- "once a month"   Drug use: Never   Sexual activity: Not Currently    Birth control/protection: None  Other Topics Concern   Not on file  Social History Narrative   Not on file   Social Drivers of Health   Financial Resource Strain: Low Risk  (02/01/2022)   Overall Financial Resource Strain (CARDIA)    Difficulty of Paying Living Expenses: Not hard at all  Food Insecurity: No Food Insecurity (02/01/2022)   Hunger Vital Sign    Worried About Running Out of Food in the Last Year: Never true    Ran Out of Food in the Last Year: Never true  Transportation Needs: No Transportation Needs (02/01/2022)   PRAPARE - Administrator, Civil Service (Medical): No    Lack of Transportation (Non-Medical): No  Physical Activity: Insufficiently Active (02/01/2022)   Exercise Vital Sign    Days of Exercise per Week: 4 days    Minutes of Exercise per Session: 30 min  Stress: Stress Concern Present (02/01/2022)   [employer] of Occupational Health - Occupational Stress Questionnaire    Feeling of Stress : To some extent  Social Connections: Moderately Integrated (02/01/2022)   Social Connection and Isolation Panel [NHANES]    Frequency of Communication with Friends and Family: Three times a week  Frequency of Social Gatherings with Friends and Family: Once a week    Attends Religious Services: More than 4 times per year    Active Member of [employer] or Organizations: No    Attends Engineer, structural: More than 4 times per year    Marital Status: Never married  Intimate Partner Violence: Not At Risk (02/01/2022)   Humiliation, Afraid, Rape, and Kick questionnaire    Fear of Current or Ex-Partner: No    Emotionally Abused: No    Physically Abused: No     Sexually Abused: No    FAMILY HISTORY: Family History  Problem Relation Age of Onset   Breast cancer Maternal Grandmother    Melanoma Father    Anxiety disorder Brother    Depression Sister     ALLERGIES:  has no known allergies.  MEDICATIONS:  Current Outpatient Medications  Medication Sig Dispense Refill   ALPRAZolam (XANAX) 0.25 MG tablet Take 1-2 tablets (0.25-0.5 mg total) by mouth at bedtime. 60 tablet 5   divalproex (DEPAKOTE ER) 500 MG 24 hr tablet Take 2 tablets (1,000 mg total) by mouth daily. 180 tablet 1   ferrous sulfate 325 (65 FE) MG tablet Take 325 mg by mouth daily with breakfast.     Iloperidone 1 MG TABS Take 1 tablet (1 mg total) by mouth at bedtime. 90 tablet 1   Melatonin 1 MG CAPS Take by mouth.     progesterone (PROMETRIUM) 100 MG capsule Take 1 capsule (100 mg total) by mouth daily for 12 days of each calendar month as directed. 36 capsule 5   SUBVENITE 100 MG tablet Take 1 tablet (100 mg total) by mouth daily. 90 tablet 2   fluticasone (CUTIVATE) 0.05 % cream Apply to affected area twice daily for 1 week, then take 1 week off. Repeat as needed for flare. 30 g 2   liothyronine (CYTOMEL) 5 MCG tablet Take 1 tablet (5 mcg total) by mouth every morning before meal (Patient not taking: Reported on 11/15/2023) 90 tablet 2   progesterone (PROMETRIUM) 200 MG capsule Take 1 capsule (200 mg total) by mouth at bedtime for the first 12 days of each month (Patient not taking: Reported on 11/15/2023) 36 capsule 6   No current facility-administered medications for this visit.    REVIEW OF SYSTEMS:   Constitutional: ( - ) fevers, ( - )  chills , ( - ) night sweats Eyes: ( - ) blurriness of vision, ( - ) double vision, ( - ) watery eyes Ears, nose, mouth, throat, and face: ( - ) mucositis, ( - ) sore throat Respiratory: ( - ) cough, ( - ) dyspnea, ( - ) wheezes Cardiovascular: ( - ) palpitation, ( - ) chest discomfort, ( - ) lower extremity swelling Gastrointestinal:  ( -  ) nausea, ( - ) heartburn, ( - ) change in bowel habits Skin: ( - ) abnormal skin rashes Lymphatics: ( - ) new lymphadenopathy, ( - ) easy bruising Neurological: ( - ) numbness, ( - ) tingling, ( - ) new weaknesses Behavioral/Psych: ( - ) mood change, ( - ) new changes  All other systems were reviewed with the patient and are negative.  PHYSICAL EXAMINATION: ECOG PERFORMANCE STATUS: 1 - Symptomatic but completely ambulatory  Vitals:   11/15/23 0902  BP: 113/65  Pulse: 72  Resp: 16  Temp: 97.6 F (36.4 C)  SpO2: 100%   Filed Weights   11/15/23 0902  Weight: 146 lb 3.2 oz (66.3  kg)    GENERAL: well appearing female in NAD  SKIN: skin color, texture, turgor are normal, no rashes or significant lesions EYES: conjunctiva are pink and non-injected, sclera clear OROPHARYNX: no exudate, no erythema; lips, buccal mucosa, and tongue normal  NECK: supple, non-tender LUNGS: clear to auscultation and percussion with normal breathing effort HEART: regular rate & rhythm and no murmurs and no lower extremity edema Musculoskeletal: no cyanosis of digits and no clubbing  PSYCH: alert & oriented x 3, fluent speech NEURO: no focal motor/sensory deficits  LABORATORY DATA:  I have reviewed the data as listed    Latest Ref Rng & Units 11/15/2023    9:47 AM 07/16/2018    1:24 PM  CBC  WBC 4.0 - 10.5 K/uL 3.7  5.7   Hemoglobin 12.0 - 15.0 g/dL 40.9  81.1   Hematocrit 36.0 - 46.0 % 39.7  38.8   Platelets 150 - 400 K/uL 178  186        Latest Ref Rng & Units 11/15/2023    9:47 AM 06/19/2021    4:11 PM 07/16/2018    1:24 PM  CMP  Glucose 70 - 99 mg/dL 92  914  82   BUN 6 - 20 mg/dL 12  12  15    Creatinine 0.44 - 1.00 mg/dL 7.82  9.56  2.13   Sodium 135 - 145 mmol/L 139  141  140   Potassium 3.5 - 5.1 mmol/L 4.1  4.3  4.2   Chloride 98 - 111 mmol/L 104  103  102   CO2 22 - 32 mmol/L 31  24  24    Calcium 8.9 - 10.3 mg/dL 9.4  9.0  9.1   Total Protein 6.5 - 8.1 g/dL 7.0  6.6  6.5    Total Bilirubin 0.0 - 1.2 mg/dL 0.5  <0.8  0.3   Alkaline Phos 38 - 126 U/L 35  50  37   AST 15 - 41 U/L 11  10  11    ALT 0 - 44 U/L 12  8  9       RADIOGRAPHIC STUDIES: I have personally reviewed the radiological images as listed and agreed with the findings in the report. MM 3D DIAGNOSTIC MAMMOGRAM UNILATERAL RIGHT BREAST Result Date: 10/17/2023 CLINICAL DATA:  Patient presents for a diagnostic right breast evaluation to follow-up probable benign microcalcifications. EXAM: DIGITAL DIAGNOSTIC UNILATERAL RIGHT MAMMOGRAM WITH TOMOSYNTHESIS AND CAD TECHNIQUE: Right digital diagnostic mammography and breast tomosynthesis was performed. The images were evaluated with computer-aided detection. COMPARISON:  Previous exam(s). ACR Breast Density Category c: The breasts are heterogeneously dense, which may obscure small masses. FINDINGS: Examination demonstrates a stable group of microcalcifications over the posterior upper right breast difficult to visualize on the CC image. Remainder of the right breast is unchanged. IMPRESSION: Stable group of probable benign microcalcifications over the posterior upper right breast. RECOMMENDATION: Recommend an additional six-month follow-up diagnostic right breast mammogram with magnification views at the time patient's annual bilateral mammogram in September 2025 to document continued stability. I have discussed the findings and recommendations with the patient. If applicable, a reminder letter will be sent to the patient regarding the next appointment. BI-RADS CATEGORY  3: Probably benign. Electronically Signed   By: Roda Cirri M.D.   On: 10/17/2023 16:01    ASSESSMENT & PLAN Cindy Woods is a 48 y.o. female who presents to the clinic for evaluation of iron deficiency.   # Iron Deficiency --We will confirm iron deficiency anemia by ordering  iron panel and ferritin as well as CBC, and CMP --Continue ferrous sulfate 325 mg daily with a source of vitamin  C --Will determine if IV iron is needed based on today's workup.  #Fatigue: --Will obtain nutritional studies to check vitamin B12, MMA and folate levels. --Will evaluate for underlying inflammatory condition by checking sedimentation rate and CRP levels. -- If our workup is negative, recommend to follow-up with PCP to further evaluate underlying causes.  Follow up: --RTC will be based on today's workup.   Orders Placed This Encounter  Procedures   CBC with Differential (Cancer Center Only)    Standing Status:   Future    Number of Occurrences:   1    Expiration Date:   11/14/2024   Ferritin    Standing Status:   Future    Number of Occurrences:   1    Expiration Date:   11/14/2024   Iron and Iron Binding Capacity ([facility] only)    Standing Status:   Future    Number of Occurrences:   1    Expiration Date:   11/14/2024   CMP (Cancer Center only)    Standing Status:   Future    Number of Occurrences:   1    Expiration Date:   11/14/2024   Vitamin B12    Standing Status:   Future    Number of Occurrences:   1    Expiration Date:   11/14/2024   Methylmalonic acid, serum    Standing Status:   Future    Number of Occurrences:   1    Expiration Date:   11/14/2024   Folate, Serum    Standing Status:   Future    Number of Occurrences:   1    Expiration Date:   11/14/2024   Sedimentation rate    Standing Status:   Future    Number of Occurrences:   1    Expiration Date:   11/14/2024   C-reactive protein    Standing Status:   Future    Number of Occurrences:   1    Expiration Date:   11/14/2024    All questions were answered. The patient knows to call the clinic with any problems, questions or concerns.  I have spent a total of 60 minutes minutes of face-to-face and non-face-to-face time, preparing to see the patient, obtaining and/or reviewing separately obtained history, performing a medically appropriate examination, counseling and educating the patient, ordering  medications/tests/procedures, referring and communicating with other health care professionals, documenting clinical information in the electronic health record, independently interpreting results and communicating results to the patient, and care coordination.   Wyline Hearing, PA-C Department of Hematology/Oncology [facility] Phone: [phone]  Patient was seen with Dr. Rosaline Coma  I have read the above note and personally examined the patient. I agree with the assessment and plan as noted above.  Briefly Mrs. Kena Limon is a 48 year old female who resents for evaluation of persistent fatigue despite treatment of iron deficiency anemia.  The patient has iron deficiency anemia thought to be secondary to GYN bleeding.  She has been on p.o. iron supplementation with ferrous sulfate 325 mg p.o. daily.  She is tolerating it well with no stomach upset.  Despite correction and her iron levels and hemoglobin she has persistent fatigue.  She presents today for further evaluation.  Today we will repeat her iron studies and additionally collect folate and vitamin B12 levels as well  as methylmalonic acid.  In the event that our additional studies are within normal limits would recommend continued evaluation with her PCP for alternative sources of fatigue.  Patient voiced understanding of our findings and plan moving forward.   Rogerio Clay, MD Department of Hematology/Oncology [facility] Phone: [phone] Pager: [phone] Email: [email or web address]

## 2023-11-15 ENCOUNTER — Encounter: Payer: Self-pay | Admitting: Physician Assistant

## 2023-11-15 ENCOUNTER — Inpatient Hospital Stay

## 2023-11-15 ENCOUNTER — Inpatient Hospital Stay: Attending: Physician Assistant | Admitting: Physician Assistant

## 2023-11-15 ENCOUNTER — Other Ambulatory Visit (HOSPITAL_COMMUNITY): Payer: Self-pay

## 2023-11-15 VITALS — BP 113/65 | HR 72 | Temp 97.6°F | Resp 16 | Ht 70.5 in | Wt 146.2 lb

## 2023-11-15 DIAGNOSIS — Z79899 Other long term (current) drug therapy: Secondary | ICD-10-CM | POA: Insufficient documentation

## 2023-11-15 DIAGNOSIS — E611 Iron deficiency: Secondary | ICD-10-CM

## 2023-11-15 DIAGNOSIS — R5383 Other fatigue: Secondary | ICD-10-CM

## 2023-11-15 DIAGNOSIS — D509 Iron deficiency anemia, unspecified: Secondary | ICD-10-CM | POA: Insufficient documentation

## 2023-11-15 DIAGNOSIS — Z79890 Hormone replacement therapy (postmenopausal): Secondary | ICD-10-CM | POA: Diagnosis not present

## 2023-11-15 DIAGNOSIS — R0602 Shortness of breath: Secondary | ICD-10-CM | POA: Diagnosis not present

## 2023-11-15 DIAGNOSIS — R92333 Mammographic heterogeneous density, bilateral breasts: Secondary | ICD-10-CM | POA: Diagnosis not present

## 2023-11-15 DIAGNOSIS — R000 Tachycardia, unspecified: Secondary | ICD-10-CM | POA: Insufficient documentation

## 2023-11-15 DIAGNOSIS — Z803 Family history of malignant neoplasm of breast: Secondary | ICD-10-CM | POA: Insufficient documentation

## 2023-11-15 DIAGNOSIS — F3181 Bipolar II disorder: Secondary | ICD-10-CM | POA: Insufficient documentation

## 2023-11-15 LAB — CBC WITH DIFFERENTIAL (CANCER CENTER ONLY)
Abs Immature Granulocytes: 0.01 10*3/uL (ref 0.00–0.07)
Basophils Absolute: 0 10*3/uL (ref 0.0–0.1)
Basophils Relative: 1 %
Eosinophils Absolute: 0.2 10*3/uL (ref 0.0–0.5)
Eosinophils Relative: 6 %
HCT: 39.7 % (ref 36.0–46.0)
Hemoglobin: 14.4 g/dL (ref 12.0–15.0)
Immature Granulocytes: 0 %
Lymphocytes Relative: 46 %
Lymphs Abs: 1.7 10*3/uL (ref 0.7–4.0)
MCH: 33 pg (ref 26.0–34.0)
MCHC: 36.3 g/dL — ABNORMAL HIGH (ref 30.0–36.0)
MCV: 90.8 fL (ref 80.0–100.0)
Monocytes Absolute: 0.3 10*3/uL (ref 0.1–1.0)
Monocytes Relative: 8 %
Neutro Abs: 1.5 10*3/uL — ABNORMAL LOW (ref 1.7–7.7)
Neutrophils Relative %: 39 %
Platelet Count: 178 10*3/uL (ref 150–400)
RBC: 4.37 MIL/uL (ref 3.87–5.11)
RDW: 11.2 % — ABNORMAL LOW (ref 11.5–15.5)
WBC Count: 3.7 10*3/uL — ABNORMAL LOW (ref 4.0–10.5)
nRBC: 0 % (ref 0.0–0.2)

## 2023-11-15 LAB — VITAMIN B12: Vitamin B-12: 870 pg/mL (ref 180–914)

## 2023-11-15 LAB — IRON AND IRON BINDING CAPACITY (CC-WL,HP ONLY)
Iron: 88 ug/dL (ref 28–170)
Saturation Ratios: 33 % — ABNORMAL HIGH (ref 10.4–31.8)
TIBC: 266 ug/dL (ref 250–450)
UIBC: 178 ug/dL (ref 148–442)

## 2023-11-15 LAB — CMP (CANCER CENTER ONLY)
ALT: 12 U/L (ref 0–44)
AST: 11 U/L — ABNORMAL LOW (ref 15–41)
Albumin: 4.5 g/dL (ref 3.5–5.0)
Alkaline Phosphatase: 35 U/L — ABNORMAL LOW (ref 38–126)
Anion gap: 4 — ABNORMAL LOW (ref 5–15)
BUN: 12 mg/dL (ref 6–20)
CO2: 31 mmol/L (ref 22–32)
Calcium: 9.4 mg/dL (ref 8.9–10.3)
Chloride: 104 mmol/L (ref 98–111)
Creatinine: 0.73 mg/dL (ref 0.44–1.00)
GFR, Estimated: >60 mL/min (ref >60)
Glucose, Bld: 92 mg/dL (ref 70–99)
Potassium: 4.1 mmol/L (ref 3.5–5.1)
Sodium: 139 mmol/L (ref 135–145)
Total Bilirubin: 0.5 mg/dL (ref 0.0–1.2)
Total Protein: 7 g/dL (ref 6.5–8.1)

## 2023-11-15 LAB — C-REACTIVE PROTEIN: CRP: 0.5 mg/dL (ref <1.0)

## 2023-11-15 LAB — FOLATE: Folate: 13.8 ng/mL (ref >5.9)

## 2023-11-15 LAB — FERRITIN: Ferritin: 34 ng/mL (ref 11–307)

## 2023-11-15 LAB — SEDIMENTATION RATE: Sed Rate: 4 mm/h (ref 0–22)

## 2023-11-18 LAB — METHYLMALONIC ACID, SERUM: Methylmalonic Acid, Quantitative: 106 nmol/L (ref 0–378)

## 2023-11-21 ENCOUNTER — Encounter: Payer: Self-pay | Admitting: Physician Assistant

## 2023-11-21 ENCOUNTER — Encounter: Payer: Self-pay | Admitting: Family Medicine

## 2023-12-13 ENCOUNTER — Other Ambulatory Visit: Payer: Self-pay

## 2023-12-14 ENCOUNTER — Other Ambulatory Visit (HOSPITAL_COMMUNITY): Payer: Self-pay

## 2023-12-15 ENCOUNTER — Other Ambulatory Visit: Payer: Self-pay

## 2023-12-15 DIAGNOSIS — F5101 Primary insomnia: Secondary | ICD-10-CM

## 2023-12-15 DIAGNOSIS — F419 Anxiety disorder, unspecified: Secondary | ICD-10-CM

## 2023-12-15 MED ORDER — ILOPERIDONE 1 MG PO TABS
1.0000 mg | ORAL_TABLET | Freq: Every day | ORAL | 0 refills | Status: DC
Start: 2023-12-15 — End: 2024-02-03

## 2023-12-16 ENCOUNTER — Other Ambulatory Visit (HOSPITAL_COMMUNITY): Payer: Self-pay

## 2024-01-14 ENCOUNTER — Other Ambulatory Visit (HOSPITAL_COMMUNITY): Payer: Self-pay

## 2024-01-14 ENCOUNTER — Other Ambulatory Visit: Payer: Self-pay

## 2024-01-22 ENCOUNTER — Telehealth: Payer: Self-pay | Admitting: Behavioral Health

## 2024-01-22 ENCOUNTER — Ambulatory Visit: Payer: 59 | Admitting: Behavioral Health

## 2024-01-22 NOTE — Telephone Encounter (Signed)
 Pt had apt today. RS too late. Reported she has had blurred vision started a month ago. Happened about 2-3 times and last about an hour. Please advise [phone]. Apt 8/13

## 2024-01-22 NOTE — Telephone Encounter (Signed)
 Recommended patient have her eyes checked. Said she has never had her eyes checked, but has made an appt.

## 2024-02-03 ENCOUNTER — Other Ambulatory Visit (HOSPITAL_BASED_OUTPATIENT_CLINIC_OR_DEPARTMENT_OTHER): Payer: Self-pay

## 2024-02-03 ENCOUNTER — Encounter: Payer: Self-pay | Admitting: Behavioral Health

## 2024-02-03 ENCOUNTER — Ambulatory Visit: Admitting: Behavioral Health

## 2024-02-03 DIAGNOSIS — F5101 Primary insomnia: Secondary | ICD-10-CM | POA: Diagnosis not present

## 2024-02-03 DIAGNOSIS — F39 Unspecified mood [affective] disorder: Secondary | ICD-10-CM | POA: Diagnosis not present

## 2024-02-03 DIAGNOSIS — F3181 Bipolar II disorder: Secondary | ICD-10-CM

## 2024-02-03 DIAGNOSIS — F419 Anxiety disorder, unspecified: Secondary | ICD-10-CM | POA: Diagnosis not present

## 2024-02-03 MED ORDER — ILOPERIDONE 1 MG PO TABS
1.0000 mg | ORAL_TABLET | Freq: Every day | ORAL | 0 refills | Status: DC
Start: 2024-02-03 — End: 2024-06-30

## 2024-02-03 MED ORDER — SUBVENITE 100 MG PO TABS
100.0000 mg | ORAL_TABLET | Freq: Every day | ORAL | 2 refills | Status: AC
  Filled 2024-02-03 – 2024-02-14 (×5): qty 30, 30d supply, fill #0
  Filled 2024-03-04: qty 5, 5d supply, fill #1
  Filled 2024-03-04: qty 30, 30d supply, fill #0
  Filled 2024-03-04: qty 5, 5d supply, fill #0
  Filled 2024-03-17: qty 30, 30d supply, fill #0
  Filled 2024-03-17 – 2024-03-18 (×3): qty 5, 5d supply, fill #0
  Filled 2024-07-21: qty 30, 30d supply, fill #1

## 2024-02-03 MED ORDER — DIVALPROEX SODIUM ER 500 MG PO TB24
1000.0000 mg | ORAL_TABLET | Freq: Every day | ORAL | 1 refills | Status: DC
Start: 2024-02-03 — End: 2024-05-15

## 2024-02-03 MED ORDER — ALPRAZOLAM 0.25 MG PO TABS: 0.2500 mg | ORAL_TABLET | Freq: Every day | ORAL | 5 refills | Status: AC

## 2024-02-03 NOTE — Progress Notes (Signed)
 [facility]  Patient ID: Cindy Woods,  MRN: [id number]  PCP: Pcp, No  Date of Evaluation: 02/03/2024 Time spent:30 minutes  Chief Complaint:  Chief Complaint   Depression; Anxiety; Follow-up; Medication Refill; Patient Education     HISTORY/CURRENT STATUS: HPI  Cindy Woods presents to the office today for follow-up of anxiety, insomnia, and mood disturbance. She is here today because she wanted to discuss her labs that was previously ordered by Harlene Pepper in January. Her concerns were how Depakote  may be elevating her Iron levels.  She reports that her mood has been good. She feels that depression has improved with decrease in progesterone . Anxiety has been fine. Reports irritability has been fine. Reports sleeping well with medication and averages 7-8 hours a night. Energy and motivation have been ok. Concentration has been ok.  Denies mania, no psychosis, no auditory or visual hallucinations. Appetite has been good. Denies SI. Request f/u in 6 months.    Past Psychiatric Medication Trials: Seroquel Viibryd Wellbutrin  Klonopin Latuda Abilify Fanapt - Initially helpful at 1 mg Lithium Zoloft -Adverse effects Effexor- May have been helpful Celexa Paxil Prozac BuSpar Lexapro Nuvigil Sonata - Was no longer effective and then was having to take more. Reports that she has had some residual grogginess the following day.  Xanax  Rexulti-restlessness Ativan Hydroxyzine Trintellix  Temazepam Mirtazapine - Excessive daytime somnolence.  Lamictal - anxious side effect.  Subvenite  has been more effective and better tolerated Subvenite - more effective and better tolerated compared to generic Depakote  Trazodone Dayvigo - Minimally effective and excessive grogginess Gabapentin - excessive somnolence       Individual Medical History/ Review of Systems: Changes? :No   Allergies: Patient has no known allergies.  Current Medications:   Current Outpatient Medications:    ALPRAZolam  (XANAX ) 0.25 MG tablet, Take 1-2 tablets (0.25-0.5 mg total) by mouth at bedtime., Disp: 60 tablet, Rfl: 5   divalproex  (DEPAKOTE  ER) 500 MG 24 hr tablet, Take 2 tablets (1,000 mg total) by mouth daily., Disp: 180 tablet, Rfl: 1   ferrous sulfate 325 (65 FE) MG tablet, Take 325 mg by mouth daily with breakfast., Disp: , Rfl:    Iloperidone  1 MG TABS, Take 1 tablet (1 mg total) by mouth at bedtime., Disp: 90 tablet, Rfl: 0   Melatonin 1 MG CAPS, Take by mouth., Disp: , Rfl:    progesterone  (PROMETRIUM ) 100 MG capsule, Take 1 capsule (100 mg total) by mouth daily for 12 days of each calendar month as directed., Disp: 36 capsule, Rfl: 5   SUBVENITE  100 MG tablet, Take 1 tablet (100 mg total) by mouth daily., Disp: 90 tablet, Rfl: 2 Medication Side Effects: none  Family Medical/ Social History: Changes? No  MENTAL HEALTH EXAM:  There were no vitals taken for this visit.There is no height or weight on file to calculate BMI.  General Appearance: Casual, Neat, and Well Groomed  Eye Contact:  Good  Speech:  Clear and Coherent  Volume:  Normal  Mood:  Anxious  Affect:  Flat  Thought Process:  Coherent  Orientation:  Full (Time, Place, and Person)  Thought Content: Logical   Suicidal Thoughts:  No  Homicidal Thoughts:  No  Memory:  WNL  Judgement:  Good  Insight:  Good  Psychomotor Activity:  Normal  Concentration:  Concentration: Good  Recall:  Good  Fund of Knowledge: Good  Language: Good  Assets:  Desire for Improvement  ADL's:  Intact  Cognition: WNL  Prognosis:  Good    DIAGNOSES:    ICD-10-CM  1. Bipolar 2 disorder (HCC)  F31.81     2. Primary insomnia  F51.01 ALPRAZolam  (XANAX ) 0.25 MG tablet    Iloperidone  1 MG TABS    3. Anxiety disorder, unspecified type  F41.9 ALPRAZolam  (XANAX ) 0.25 MG tablet    Iloperidone  1 MG TABS    4. Episodic mood disorder (HCC)  F39 divalproex  (DEPAKOTE  ER) 500 MG 24 hr tablet    SUBVENITE   100 MG tablet      Receiving Psychotherapy: No    RECOMMENDATIONS:   30 minutes spent dedicated to the care of this patient on the date of this encounter to include pre-visit review of records, ordering of medication, post visit documentation, and face-to-face time with the patient discussing lab results and potential adverse effects of Depakote .  Should recheck Depakote  levels next visit.  We talked about her continue complaint of fatigue. She discusses having some blurry vision and wants to make appt with Ophthalmology to have checked out. Agreed on no medication changes today.  Needs new labs for Depakote .   Will continue Depakote  ER 1000 mg at bedtime for mood symptoms.  Continue Subvenite  100 mg daily for mood symptoms.  Continue Fanapt  1 mg po at bedtime for anxiety, mood symptoms, and insomnia.  Continue Alprazolam  0.25 mg 1-2 tablets at bedtime for anxiety and insomnia.  Pt to follow-up in 6 months or sooner if clinically indicated.  Patient advised to contact office with any questions, adverse effects, or acute worsening in signs and symptoms.  [name], NP

## 2024-02-06 ENCOUNTER — Other Ambulatory Visit (HOSPITAL_BASED_OUTPATIENT_CLINIC_OR_DEPARTMENT_OTHER): Payer: Self-pay

## 2024-02-10 ENCOUNTER — Other Ambulatory Visit (HOSPITAL_BASED_OUTPATIENT_CLINIC_OR_DEPARTMENT_OTHER): Payer: Self-pay

## 2024-02-14 ENCOUNTER — Other Ambulatory Visit (HOSPITAL_COMMUNITY): Payer: Self-pay

## 2024-02-14 ENCOUNTER — Other Ambulatory Visit (HOSPITAL_BASED_OUTPATIENT_CLINIC_OR_DEPARTMENT_OTHER): Payer: Self-pay

## 2024-03-03 ENCOUNTER — Ambulatory Visit: Admitting: Behavioral Health

## 2024-03-04 ENCOUNTER — Other Ambulatory Visit (HOSPITAL_BASED_OUTPATIENT_CLINIC_OR_DEPARTMENT_OTHER): Payer: Self-pay

## 2024-03-04 ENCOUNTER — Other Ambulatory Visit: Payer: Self-pay

## 2024-03-04 ENCOUNTER — Other Ambulatory Visit (HOSPITAL_COMMUNITY): Payer: Self-pay

## 2024-03-05 ENCOUNTER — Other Ambulatory Visit (HOSPITAL_COMMUNITY): Payer: Self-pay

## 2024-03-05 ENCOUNTER — Ambulatory Visit (INDEPENDENT_AMBULATORY_CARE_PROVIDER_SITE_OTHER): Admitting: Behavioral Health

## 2024-03-05 ENCOUNTER — Encounter: Payer: Self-pay | Admitting: Behavioral Health

## 2024-03-05 DIAGNOSIS — F900 Attention-deficit hyperactivity disorder, predominantly inattentive type: Secondary | ICD-10-CM | POA: Diagnosis not present

## 2024-03-05 MED ORDER — AMPHETAMINE-DEXTROAMPHETAMINE 10 MG PO TABS
10.0000 mg | ORAL_TABLET | Freq: Every day | ORAL | 0 refills | Status: DC
  Filled 2024-03-05 – 2024-03-16 (×2): qty 30, 30d supply, fill #0

## 2024-03-05 NOTE — Progress Notes (Signed)
 [facility]  Patient ID: Cindy Woods,  MRN: [id number]  PCP: Pcp, No  Date of Evaluation: 03/05/2024 Time spent:30 minutes  Chief Complaint:   HISTORY/CURRENT STATUS: HPI Cindy Woods presents to the office today for follow-up of anxiety, insomnia, and mood disturbance. She is here today because she was wanting to be screened for ADHD. Say that she exhibits symptoms that are concerning and wonders if it may have been overlooked with her dx and long list of previous medication trials. She endorses losing attention on subject or activities that are boring, become easily fatigued at boring events, severely distracted by noises, poor organization, procrastination, frequently losing or misplacing items, remembering obligations or appointments.   She reports that her mood has been good. She feels that depression has improved with decrease in progesterone . Anxiety has been fine. Reports irritability has been fine. Reports sleeping well with medication and averages 7-8 hours a night. Energy and motivation have been ok except in social setting where she is not interested.   Denies mania, no psychosis, no auditory or visual hallucinations. Appetite has been good. Denies SI.    Past Psychiatric Medication Trials: Seroquel Viibryd Wellbutrin  Klonopin Latuda Abilify Fanapt - Initially helpful at 1 mg Lithium Zoloft -Adverse effects Effexor- May have been helpful Celexa Paxil Prozac BuSpar Lexapro Nuvigil Sonata - Was no longer effective and then was having to take more. Reports that she has had some residual grogginess the following day.  Xanax  Rexulti-restlessness Ativan Hydroxyzine Trintellix  Temazepam Mirtazapine - Excessive daytime somnolence.  Lamictal - anxious side effect.  Subvenite  has been more effective and better tolerated Subvenite - more effective and better tolerated compared to generic Depakote  Trazodone Dayvigo - Minimally effective  and excessive grogginess Gabapentin - excessive somnolence     Individual Medical History/ Review of Systems: Changes? :No   Allergies: Patient has no known allergies.  Current Medications:  Current Outpatient Medications:    amphetamine -dextroamphetamine  (ADDERALL) 10 MG tablet, Take 1 tablet (10 mg total) by mouth daily after breakfast., Disp: 30 tablet, Rfl: 0   ALPRAZolam  (XANAX ) 0.25 MG tablet, Take 1-2 tablets (0.25-0.5 mg total) by mouth at bedtime., Disp: 60 tablet, Rfl: 5   divalproex  (DEPAKOTE  ER) 500 MG 24 hr tablet, Take 2 tablets (1,000 mg total) by mouth daily., Disp: 180 tablet, Rfl: 1   ferrous sulfate 325 (65 FE) MG tablet, Take 325 mg by mouth daily with breakfast., Disp: , Rfl:    Iloperidone  1 MG TABS, Take 1 tablet (1 mg total) by mouth at bedtime., Disp: 90 tablet, Rfl: 0   Melatonin 1 MG CAPS, Take by mouth., Disp: , Rfl:    progesterone  (PROMETRIUM ) 100 MG capsule, Take 1 capsule (100 mg total) by mouth daily for 12 days of each calendar month as directed., Disp: 36 capsule, Rfl: 5   SUBVENITE  100 MG tablet, Take 1 tablet (100 mg total) by mouth daily., Disp: 90 tablet, Rfl: 2 Medication Side Effects: none  Family Medical/ Social History: Changes? No  MENTAL HEALTH EXAM:  There were no vitals taken for this visit.There is no height or weight on file to calculate BMI.  General Appearance: Casual, Neat, and Well Groomed  Eye Contact:  Good  Speech:  Clear and Coherent  Volume:  Normal  Mood:  Anxious  Affect:  Congruent and Anxious  Thought Process:  Coherent  Orientation:  Full (Time, Place, and Person)  Thought Content: Logical   Suicidal Thoughts:  No  Homicidal Thoughts:  No  Memory:  WNL  Judgement:  Fair  Insight:  Good  Psychomotor Activity:  Normal  Concentration:  Concentration: Good  Recall:  Good  Fund of Knowledge: Good  Language: Good  Assets:  Desire for Improvement  ADL's:  Intact  Cognition: WNL  Prognosis:  Good    DIAGNOSES:     ICD-10-CM   1. Attention deficit hyperactivity disorder (ADHD), predominantly inattentive type  F90.0 amphetamine -dextroamphetamine  (ADDERALL) 10 MG tablet      Receiving Psychotherapy: No    RECOMMENDATIONS:   40 minutes spent dedicated to the care of this patient on the date of this encounter to include pre-visit review of records, ordering of medication, post visit documentation, and face-to-face time with the patient discussing lab results and potential adverse effects of Depakote .  Should recheck Depakote  levels next visit.  We talked about her continue complaint of fatigue and becoming frustrated in social settings. Discussed her concerns about possible dx of ADHD and that it may have been overlooked. She was wanting to be screened today.   Conducted Adult self report questionnaire Scores were as follows: Part A=27 Part B=16 A score of 19 or higher on either part A or part B are indicative of ADHD.  Pt does appear scattered and non focused at times. Long hx of prior medication trials. Agreed to trial of low dose Adderall today to see if it alleviate some symptoms.    To start Adderall 10 mg daily in the am Will continue Depakote  ER 1000 mg at bedtime for mood symptoms.  Continue Subvenite  100 mg daily for mood symptoms.  Continue Fanapt  1 mg po at bedtime for anxiety, mood symptoms, and insomnia.  Continue Alprazolam  0.25 mg 1-2 tablets at bedtime for anxiety and insomnia.  Pt to follow-up in 3 weeks  Patient advised to contact office with any questions, adverse effects, or acute worsening in signs and symptoms. Discussed potential benefits, risks, and side effects of stimulants with patient to include increased heart rate, palpitations, insomnia, increased anxiety, increased irritability, or decreased appetite.  Instructed patient to contact office if experiencing any significant tolerability issues.  Reviewed PDMP   [name], NP           [name], NP

## 2024-03-16 ENCOUNTER — Other Ambulatory Visit (HOSPITAL_COMMUNITY): Payer: Self-pay

## 2024-03-17 ENCOUNTER — Other Ambulatory Visit (HOSPITAL_COMMUNITY): Payer: Self-pay

## 2024-03-18 ENCOUNTER — Ambulatory Visit: Admitting: Behavioral Health

## 2024-03-18 ENCOUNTER — Telehealth: Payer: Self-pay | Admitting: Behavioral Health

## 2024-03-18 ENCOUNTER — Other Ambulatory Visit (HOSPITAL_COMMUNITY): Payer: Self-pay

## 2024-03-18 MED ORDER — LAMOTRIGINE 100 MG PO TABS
100.0000 mg | ORAL_TABLET | Freq: Every day | ORAL | 0 refills | Status: DC
  Filled 2024-03-18 – 2024-03-21 (×4): qty 30, 30d supply, fill #0
  Filled ????-??-??: fill #0

## 2024-03-18 NOTE — Telephone Encounter (Signed)
 Sent Rx for lamotrigene to [facility].

## 2024-03-18 NOTE — Telephone Encounter (Signed)
 Pt called reporting pharmacy advised Subvenite  100 mg tablet on back order and gave pt last 5 tablets. Pharmacy not sure when supply will arrive. Pt stated this is the only med that works for her. Pt contact [phone]

## 2024-03-21 ENCOUNTER — Other Ambulatory Visit (HOSPITAL_COMMUNITY): Payer: Self-pay

## 2024-03-27 ENCOUNTER — Other Ambulatory Visit (HOSPITAL_BASED_OUTPATIENT_CLINIC_OR_DEPARTMENT_OTHER): Payer: Self-pay

## 2024-03-27 ENCOUNTER — Ambulatory Visit: Admitting: Behavioral Health

## 2024-03-27 ENCOUNTER — Encounter: Payer: Self-pay | Admitting: Behavioral Health

## 2024-03-27 DIAGNOSIS — F900 Attention-deficit hyperactivity disorder, predominantly inattentive type: Secondary | ICD-10-CM

## 2024-03-27 MED ORDER — AMPHETAMINE-DEXTROAMPHETAMINE 10 MG PO TABS: 10.0000 mg | ORAL_TABLET | Freq: Every day | ORAL | 0 refills | Status: AC

## 2024-03-27 NOTE — Progress Notes (Signed)
 [facility]  Patient ID: Breck Hollinger,  MRN: [id number]  PCP: Pcp, No  Date of Evaluation: 03/27/2024 Time spent:30 minutes  Chief Complaint:  Chief Complaint   Anxiety; Depression; ADHD; Follow-up; Medication Refill; Patient Education     HISTORY/CURRENT STATUS: HPI Izzabell Shenaya Lebo, 48 year old female presents to the office today for follow-up of ADHD, and mood disturbance. Report that she was unable to to get Subvenite  and was called in Lamictal . Says that she does not have appetite with Lamictal  and the medication is not as effective. Does not like the way it makes her feel. She will call surrounding pharmacies and see if she can locate in stock. She believes Adderall has been partially effective in the sense she has felt more social and like the medication has had antidepressant properties.  Has skipped days and reports some crash.  She would like to continue trial of the medication over the next few weeks.  Reports sleeping well with medication and averages 7-8 hours a night. Energy and motivation have been ok except in social setting where she is not interested.   Denies mania, no psychosis, no auditory or visual hallucinations. Appetite has been good. Denies SI.    Past Psychiatric Medication Trials: Seroquel Viibryd Wellbutrin  Klonopin Latuda Abilify Fanapt - Initially helpful at 1 mg Lithium Zoloft -Adverse effects Effexor- May have been helpful Celexa Paxil Prozac BuSpar Lexapro Nuvigil Sonata - Was no longer effective and then was having to take more. Reports that she has had some residual grogginess the following day.  Xanax  Rexulti-restlessness Ativan Hydroxyzine Trintellix  Temazepam Mirtazapine - Excessive daytime somnolence.  Lamictal - anxious side effect.  Subvenite  has been more effective and better tolerated Subvenite - more effective and better tolerated compared to generic Depakote  Trazodone Dayvigo - Minimally  effective and excessive grogginess Gabapentin - excessive somnolence      Individual Medical History/ Review of Systems: Changes? :No   Allergies: Patient has no known allergies.  Current Medications:  Current Outpatient Medications:    ALPRAZolam  (XANAX ) 0.25 MG tablet, Take 1-2 tablets (0.25-0.5 mg total) by mouth at bedtime., Disp: 60 tablet, Rfl: 5   [START ON 04/04/2024] amphetamine -dextroamphetamine  (ADDERALL) 10 MG tablet, Take 1 tablet (10 mg total) by mouth daily after breakfast., Disp: 30 tablet, Rfl: 0   divalproex  (DEPAKOTE  ER) 500 MG 24 hr tablet, Take 2 tablets (1,000 mg total) by mouth daily., Disp: 180 tablet, Rfl: 1   ferrous sulfate 325 (65 FE) MG tablet, Take 325 mg by mouth daily with breakfast., Disp: , Rfl:    Iloperidone  1 MG TABS, Take 1 tablet (1 mg total) by mouth at bedtime., Disp: 90 tablet, Rfl: 0   lamoTRIgine  (LAMICTAL ) 100 MG tablet, Take 1 tablet (100 mg total) by mouth daily., Disp: 30 tablet, Rfl: 0   Melatonin 1 MG CAPS, Take by mouth., Disp: , Rfl:    progesterone  (PROMETRIUM ) 100 MG capsule, Take 1 capsule (100 mg total) by mouth daily for 12 days of each calendar month as directed., Disp: 36 capsule, Rfl: 5   SUBVENITE  100 MG tablet, Take 1 tablet (100 mg total) by mouth daily., Disp: 90 tablet, Rfl: 2 Medication Side Effects: none  Family Medical/ Social History: Changes? No  MENTAL HEALTH EXAM:  There were no vitals taken for this visit.There is no height or weight on file to calculate BMI.  General Appearance: Casual and Neat  Eye Contact:  Good  Speech:  Clear and Coherent and Talkative  Volume:  Normal  Mood:  Depressed  Affect:  Non-Congruent, Depressed, and Labile  Thought Process:  Coherent  Orientation:  Full (Time, Place, and Person)  Thought Content: Logical   Suicidal Thoughts:  No  Homicidal Thoughts:  No  Memory:  WNL  Judgement:  Fair  Insight:  Fair  Psychomotor Activity:  Normal  Concentration:  Concentration: Fair   Recall:  Good  Fund of Knowledge: Fair  Language: Good  Assets:  Desire for Improvement  ADL's:  Intact  Cognition: WNL  Prognosis:  Good    DIAGNOSES:    ICD-10-CM   1. Attention deficit hyperactivity disorder (ADHD), predominantly inattentive type  F90.0 amphetamine -dextroamphetamine  (ADDERALL) 10 MG tablet      Receiving Psychotherapy: No    RECOMMENDATIONS:   30 minutes spent dedicated to the care of this patient on the date of this encounter to include pre-visit review of records, ordering of medication, post visit documentation, and face-to-face time with the patient discussing her progress with Adderall. She is frustrated that Subvenite  was not available due to side effects with Lamictal .  Should recheck Depakote  levels next visit.  She noted that Adderall does decrease her anxiety level as well as increase attention and focus. She has been willing to be more social recently.  We agreed to continue trial of Adderall for a few more weeks and she will try to locate Subvenite  on Monday. I will send RX as soon as available.      To start Adderall 10 mg daily in the am Will continue Depakote  ER 1000 mg at bedtime for mood symptoms.  Continue Subvenite  100 mg daily for mood symptoms.  Continue Fanapt  1 mg po at bedtime for anxiety, mood symptoms, and insomnia.  Continue Alprazolam  0.25 mg 1-2 tablets at bedtime for anxiety and insomnia.  Pt to follow-up in 3 weeks  Patient advised to contact office with any questions, adverse effects, or acute worsening in signs and symptoms. Discussed potential benefits, risks, and side effects of stimulants with patient to include increased heart rate, palpitations, insomnia, increased anxiety, increased irritability, or decreased appetite.  Instructed patient to contact office if experiencing any significant tolerability issues.  Reviewed PDMP    [name], NP

## 2024-03-30 ENCOUNTER — Other Ambulatory Visit (HOSPITAL_COMMUNITY): Payer: Self-pay

## 2024-04-20 ENCOUNTER — Other Ambulatory Visit (HOSPITAL_COMMUNITY): Payer: Self-pay

## 2024-04-20 ENCOUNTER — Other Ambulatory Visit: Payer: Self-pay | Admitting: Behavioral Health

## 2024-04-21 ENCOUNTER — Other Ambulatory Visit: Payer: Self-pay | Admitting: Behavioral Health

## 2024-04-21 ENCOUNTER — Other Ambulatory Visit (HOSPITAL_COMMUNITY): Payer: Self-pay

## 2024-04-21 NOTE — Telephone Encounter (Signed)
 Pt wants refill expediated  is out . Also she wants refills put on this medicine so she doesn't have to call every month

## 2024-04-22 ENCOUNTER — Other Ambulatory Visit (HOSPITAL_COMMUNITY): Payer: Self-pay

## 2024-04-22 MED ORDER — LAMOTRIGINE 100 MG PO TABS
100.0000 mg | ORAL_TABLET | Freq: Every day | ORAL | 0 refills | Status: DC
  Filled 2024-04-22: qty 30, 30d supply, fill #0
  Filled 2024-05-17: qty 30, 30d supply, fill #1
  Filled 2024-06-20: qty 30, 30d supply, fill #2

## 2024-04-24 ENCOUNTER — Ambulatory Visit: Admitting: Behavioral Health

## 2024-04-29 ENCOUNTER — Telehealth: Payer: Self-pay | Admitting: Behavioral Health

## 2024-04-29 NOTE — Telephone Encounter (Signed)
 Pt lvm that she wants the nurse to call her at [phone]

## 2024-04-29 NOTE — Telephone Encounter (Signed)
 Pt reporting she has the potential to be pregnant, is too early to test now. She is concerned with taking Depakote  with pregnancy.

## 2024-05-01 NOTE — Telephone Encounter (Signed)
 Called patient back and she is reporting an unprotected encounter. She said she started her period, but still wants to have a negative pregnancy test to rule out pregnancy. I told her if she was pregnant that other medications would need to be stopped - stimulant and BZ. She said she would contact us  again if she has a positive pregnancy test.

## 2024-05-05 ENCOUNTER — Ambulatory Visit: Admitting: Behavioral Health

## 2024-05-07 ENCOUNTER — Ambulatory Visit
Admission: RE | Admit: 2024-05-07 | Discharge: 2024-05-07 | Disposition: A | Discharge status: Home / Self Care | Source: Ambulatory Visit | Attending: Obstetrics and Gynecology | Admitting: Obstetrics and Gynecology

## 2024-05-07 DIAGNOSIS — R921 Mammographic calcification found on diagnostic imaging of breast: Secondary | ICD-10-CM

## 2024-05-15 ENCOUNTER — Other Ambulatory Visit (HOSPITAL_COMMUNITY): Payer: Self-pay

## 2024-05-15 ENCOUNTER — Telehealth: Payer: Self-pay | Admitting: Behavioral Health

## 2024-05-15 ENCOUNTER — Other Ambulatory Visit: Payer: Self-pay

## 2024-05-15 DIAGNOSIS — F39 Unspecified mood [affective] disorder: Secondary | ICD-10-CM

## 2024-05-15 MED ORDER — DIVALPROEX SODIUM ER 500 MG PO TB24
1000.0000 mg | ORAL_TABLET | Freq: Every day | ORAL | 0 refills | Status: DC
  Filled 2024-05-15: qty 4, 2d supply, fill #0

## 2024-05-15 NOTE — Telephone Encounter (Signed)
 Rx sent for #4 while waiting on mail order

## 2024-05-15 NOTE — Telephone Encounter (Signed)
 Pt called and said that she wasn't home to sign for her mail order of depakote . She would like 4 pills sent to [facility] until she can get the mail order

## 2024-05-18 ENCOUNTER — Other Ambulatory Visit: Payer: Self-pay

## 2024-05-24 ENCOUNTER — Other Ambulatory Visit (HOSPITAL_COMMUNITY): Payer: Self-pay

## 2024-05-25 ENCOUNTER — Other Ambulatory Visit (HOSPITAL_COMMUNITY): Payer: Self-pay

## 2024-06-08 ENCOUNTER — Other Ambulatory Visit (HOSPITAL_COMMUNITY): Payer: Self-pay

## 2024-06-08 MED ORDER — AMOXICILLIN-POT CLAVULANATE 875-125 MG PO TABS
1.0000 | ORAL_TABLET | Freq: Two times a day (BID) | ORAL | 0 refills | Status: AC
  Filled 2024-06-08: qty 14, 7d supply, fill #0

## 2024-06-20 ENCOUNTER — Other Ambulatory Visit (HOSPITAL_COMMUNITY): Payer: Self-pay

## 2024-06-29 ENCOUNTER — Other Ambulatory Visit: Payer: Self-pay | Admitting: Behavioral Health

## 2024-06-29 DIAGNOSIS — F39 Unspecified mood [affective] disorder: Secondary | ICD-10-CM

## 2024-06-29 DIAGNOSIS — F419 Anxiety disorder, unspecified: Secondary | ICD-10-CM

## 2024-06-29 DIAGNOSIS — F5101 Primary insomnia: Secondary | ICD-10-CM

## 2024-07-06 ENCOUNTER — Other Ambulatory Visit (HOSPITAL_COMMUNITY): Payer: Self-pay

## 2024-07-06 ENCOUNTER — Other Ambulatory Visit: Payer: Self-pay | Admitting: Behavioral Health

## 2024-07-06 ENCOUNTER — Other Ambulatory Visit: Payer: Self-pay

## 2024-07-06 DIAGNOSIS — F5101 Primary insomnia: Secondary | ICD-10-CM

## 2024-07-06 DIAGNOSIS — F419 Anxiety disorder, unspecified: Secondary | ICD-10-CM

## 2024-07-06 MED ORDER — ALPRAZOLAM 0.25 MG PO TABS
0.2500 mg | ORAL_TABLET | Freq: Every day | ORAL | 0 refills | Status: AC
  Filled 2024-07-06: qty 5, 2d supply, fill #0

## 2024-07-11 ENCOUNTER — Other Ambulatory Visit (HOSPITAL_COMMUNITY): Payer: Self-pay

## 2024-07-11 ENCOUNTER — Other Ambulatory Visit: Payer: Self-pay | Admitting: Psychiatry

## 2024-07-11 DIAGNOSIS — F419 Anxiety disorder, unspecified: Secondary | ICD-10-CM

## 2024-07-11 DIAGNOSIS — F5101 Primary insomnia: Secondary | ICD-10-CM

## 2024-07-13 NOTE — Telephone Encounter (Signed)
 Pt called reporting. Need new Rx for Alprazolam  .25 mg. Not using mail order any longer. Only sent #5. Requesting 90 day. If not at least 30 day. [facility].

## 2024-07-14 ENCOUNTER — Other Ambulatory Visit (HOSPITAL_COMMUNITY): Payer: Self-pay

## 2024-07-14 MED ORDER — ALPRAZOLAM 0.25 MG PO TABS
0.2500 mg | ORAL_TABLET | Freq: Every day | ORAL | 0 refills | Status: DC
  Filled 2024-07-14: qty 60, 30d supply, fill #0

## 2024-07-21 ENCOUNTER — Other Ambulatory Visit: Payer: Self-pay

## 2024-07-21 ENCOUNTER — Other Ambulatory Visit (HOSPITAL_COMMUNITY): Payer: Self-pay

## 2024-07-22 ENCOUNTER — Other Ambulatory Visit: Payer: Self-pay

## 2024-07-22 ENCOUNTER — Other Ambulatory Visit (HOSPITAL_COMMUNITY): Payer: Self-pay

## 2024-07-23 ENCOUNTER — Other Ambulatory Visit: Payer: Self-pay

## 2024-07-23 ENCOUNTER — Other Ambulatory Visit (HOSPITAL_COMMUNITY): Payer: Self-pay

## 2024-07-23 ENCOUNTER — Telehealth: Payer: Self-pay | Admitting: Behavioral Health

## 2024-07-23 MED ORDER — LAMOTRIGINE 100 MG PO TABS
100.0000 mg | ORAL_TABLET | Freq: Every day | ORAL | 0 refills | Status: DC
  Filled 2024-07-23 (×2): qty 30, 30d supply, fill #0
  Filled 2024-07-23: qty 90, 90d supply, fill #0
  Filled 2024-08-19: qty 30, 30d supply, fill #1

## 2024-07-23 NOTE — Telephone Encounter (Signed)
 Patient called for refill on Lamictal  100mg . States that she is completely out and got dizzy today and had to leave work early. PH: [phone] Appt 1/12 Pharmacy [facility] [id number] [place], [place]

## 2024-07-23 NOTE — Telephone Encounter (Signed)
 Sent!

## 2024-07-24 ENCOUNTER — Other Ambulatory Visit: Payer: Self-pay

## 2024-08-04 ENCOUNTER — Other Ambulatory Visit (HOSPITAL_COMMUNITY): Payer: Self-pay

## 2024-08-04 MED ORDER — FLUZONE 0.5 ML IM SUSY
0.5000 mL | PREFILLED_SYRINGE | Freq: Once | INTRAMUSCULAR | 0 refills | Status: AC
  Filled 2024-08-04: qty 0.5, 1d supply, fill #0

## 2024-08-17 ENCOUNTER — Ambulatory Visit: Admitting: Behavioral Health

## 2024-08-20 ENCOUNTER — Other Ambulatory Visit: Payer: Self-pay

## 2024-08-20 ENCOUNTER — Other Ambulatory Visit (HOSPITAL_COMMUNITY): Payer: Self-pay

## 2024-08-31 ENCOUNTER — Encounter: Payer: Self-pay | Admitting: Behavioral Health

## 2024-08-31 ENCOUNTER — Telehealth: Admitting: Behavioral Health

## 2024-08-31 ENCOUNTER — Other Ambulatory Visit (HOSPITAL_COMMUNITY): Payer: Self-pay

## 2024-08-31 DIAGNOSIS — F39 Unspecified mood [affective] disorder: Secondary | ICD-10-CM | POA: Diagnosis not present

## 2024-08-31 DIAGNOSIS — F419 Anxiety disorder, unspecified: Secondary | ICD-10-CM | POA: Diagnosis not present

## 2024-08-31 DIAGNOSIS — F5101 Primary insomnia: Secondary | ICD-10-CM

## 2024-08-31 MED ORDER — LAMOTRIGINE 100 MG PO TABS: 100.0000 mg | ORAL_TABLET | Freq: Every day | ORAL | 1 refills | Status: AC

## 2024-08-31 MED ORDER — ALPRAZOLAM 0.25 MG PO TABS
0.2500 mg | ORAL_TABLET | Freq: Every day | ORAL | 0 refills | Status: AC
  Filled 2024-08-31: qty 60, 30d supply, fill #0

## 2024-08-31 MED ORDER — DIVALPROEX SODIUM ER 500 MG PO TB24: 1000.0000 mg | ORAL_TABLET | Freq: Every day | ORAL | 0 refills | Status: AC

## 2024-08-31 MED ORDER — FANAPT 1 MG PO TABS: 1.0000 | ORAL_TABLET | Freq: Every day | ORAL | 1 refills | Status: AC
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
